# Patient Record
Sex: Female | Born: 1985 | Race: Black or African American | Hispanic: No | Marital: Single | Smoking: Never smoker
Health system: Southern US, Community
[De-identification: ages and names within clinical notes are randomized; demographics above are authoritative.]

## PROBLEM LIST (undated history)

## (undated) ENCOUNTER — Inpatient Hospital Stay (HOSPITAL_COMMUNITY): Payer: Self-pay

## (undated) DIAGNOSIS — Z789 Other specified health status: Secondary | ICD-10-CM

## (undated) DIAGNOSIS — D573 Sickle-cell trait: Secondary | ICD-10-CM

## (undated) HISTORY — DX: Sickle-cell trait: D57.3

## (undated) HISTORY — DX: Other specified health status: Z78.9

## (undated) HISTORY — PX: NO PAST SURGERIES: SHX2092

---

## 2011-12-26 ENCOUNTER — Encounter (HOSPITAL_COMMUNITY): Payer: Self-pay | Admitting: *Deleted

## 2011-12-26 ENCOUNTER — Emergency Department (HOSPITAL_COMMUNITY)
Admission: EM | Admit: 2011-12-26 | Discharge: 2011-12-26 | Disposition: A | Discharge status: Home / Self Care | Payer: Medicaid Other | Attending: Emergency Medicine | Admitting: Emergency Medicine

## 2011-12-26 DIAGNOSIS — R079 Chest pain, unspecified: Secondary | ICD-10-CM | POA: Insufficient documentation

## 2011-12-26 DIAGNOSIS — R112 Nausea with vomiting, unspecified: Secondary | ICD-10-CM | POA: Insufficient documentation

## 2011-12-26 DIAGNOSIS — O9989 Other specified diseases and conditions complicating pregnancy, childbirth and the puerperium: Secondary | ICD-10-CM | POA: Insufficient documentation

## 2011-12-26 DIAGNOSIS — R109 Unspecified abdominal pain: Secondary | ICD-10-CM | POA: Insufficient documentation

## 2011-12-26 DIAGNOSIS — Z3490 Encounter for supervision of normal pregnancy, unspecified, unspecified trimester: Secondary | ICD-10-CM

## 2011-12-26 LAB — URINALYSIS, ROUTINE W REFLEX MICROSCOPIC
Ketones, ur: 40 mg/dL — AB
Leukocytes, UA: NEGATIVE
Protein, ur: 100 mg/dL — AB
Urobilinogen, UA: 1 mg/dL (ref 0.0–1.0)

## 2011-12-26 LAB — URINE MICROSCOPIC-ADD ON

## 2011-12-26 LAB — CBC
MCH: 31.6 pg (ref 26.0–34.0)
MCHC: 36.5 g/dL — ABNORMAL HIGH (ref 30.0–36.0)
MCV: 86.6 fL (ref 78.0–100.0)
Platelets: 187 10*3/uL (ref 150–400)
RBC: 4.69 MIL/uL (ref 3.87–5.11)
RDW: 12 % (ref 11.5–15.5)

## 2011-12-26 LAB — BASIC METABOLIC PANEL
Calcium: 10.1 mg/dL (ref 8.4–10.5)
Creatinine, Ser: 0.67 mg/dL (ref 0.50–1.10)
GFR calc Af Amer: >90 mL/min (ref >90)
GFR calc non Af Amer: >90 mL/min (ref >90)
Sodium: 135 mEq/L (ref 135–145)

## 2011-12-26 LAB — DIFFERENTIAL
Basophils Absolute: 0 10*3/uL (ref 0.0–0.1)
Basophils Relative: 0 % (ref 0–1)
Eosinophils Relative: 0 % (ref 0–5)
Monocytes Absolute: 0.9 10*3/uL (ref 0.1–1.0)
Monocytes Relative: 10 % (ref 3–12)

## 2011-12-26 LAB — PREGNANCY, URINE: Preg Test, Ur: POSITIVE — AB

## 2011-12-26 MED ORDER — PANTOPRAZOLE SODIUM 40 MG IV SOLR
40.0000 mg | Freq: Once | INTRAVENOUS | Status: AC
  Administered 2011-12-26: 40 mg via INTRAVENOUS

## 2011-12-26 MED ORDER — PROMETHAZINE HCL 25 MG RE SUPP: 25.0000 mg | Freq: Four times a day (QID) | RECTAL | Status: DC | PRN

## 2011-12-26 MED ORDER — ONDANSETRON HCL 4 MG/2ML IJ SOLN
INTRAMUSCULAR | Status: AC
  Filled 2011-12-26: qty 2

## 2011-12-26 MED ORDER — ONDANSETRON HCL 4 MG/2ML IJ SOLN
4.0000 mg | Freq: Once | INTRAMUSCULAR | Status: AC
  Administered 2011-12-26: 4 mg via INTRAVENOUS

## 2011-12-26 MED ORDER — SODIUM CHLORIDE 0.9 % IV BOLUS (SEPSIS)
1000.0000 mL | Freq: Once | INTRAVENOUS | Status: AC
  Administered 2011-12-26: 1000 mL via INTRAVENOUS

## 2011-12-26 MED ORDER — ONDANSETRON HCL 4 MG/2ML IJ SOLN
4.0000 mg | Freq: Once | INTRAMUSCULAR | Status: AC
  Administered 2011-12-26: 4 mg via INTRAVENOUS
  Filled 2011-12-26: qty 2

## 2011-12-26 MED ORDER — GI COCKTAIL ~~LOC~~
30.0000 mL | Freq: Once | ORAL | Status: AC
  Administered 2011-12-26: 30 mL via ORAL
  Filled 2011-12-26: qty 30

## 2011-12-26 MED ORDER — ONDANSETRON HCL 4 MG PO TABS: 4.0000 mg | ORAL_TABLET | Freq: Four times a day (QID) | ORAL | Status: AC

## 2011-12-26 MED ORDER — POTASSIUM CHLORIDE CRYS ER 20 MEQ PO TBCR
10.0000 meq | EXTENDED_RELEASE_TABLET | Freq: Once | ORAL | Status: AC
  Administered 2011-12-26: 10 meq via ORAL
  Filled 2011-12-26: qty 1

## 2011-12-26 MED ORDER — PANTOPRAZOLE SODIUM 40 MG IV SOLR
INTRAVENOUS | Status: AC
  Filled 2011-12-26: qty 40

## 2011-12-26 NOTE — ED Provider Notes (Signed)
Medical screening examination/treatment/procedure(s) were performed by non-physician practitioner and as supervising physician I was immediately available for consultation/collaboration.   Glynn Octave, MD 12/26/11 1745

## 2011-12-26 NOTE — ED Provider Notes (Signed)
History     CSN: [id number]  Arrival date & time 12/26/11  0608   First MD Initiated Contact with Patient 12/26/11 [phone]      Chief Complaint  Patient presents with  . Emesis    (Consider location/radiation/quality/duration/timing/severity/associated sxs/prior treatment) Patient is a 26 y.o. female presenting with vomiting. The history is provided by the patient.  Emesis  This is a new problem. The current episode started more than 2 days ago. The problem occurs more than 10 times per day. The problem has not changed since onset.There has been no fever. Associated symptoms include abdominal pain. Pertinent negatives include no cough, no fever and no myalgias. Associated symptoms comments: She is in early pregnancy with her third child and reports previous 2 pregnancies associated with excessive vomiting in the first four months. No fever. She reports soreness in upper abdomen with vomiting and chest burning type symptoms. No cough, SOB. No vaginal bleeding, dysuria, or vaginal discharge.Marland Kitchen    History reviewed. No pertinent past medical history.  History reviewed. No pertinent past surgical history.  History reviewed. No pertinent family history.  History  Substance Use Topics  . Smoking status: Never Smoker   . Smokeless tobacco: Not on file  . Alcohol Use: Yes    OB History    Grav Para Term Preterm Abortions TAB SAB Ect Mult Living   1               Review of Systems  Constitutional: Negative for fever.  Respiratory: Negative for cough and shortness of breath.   Cardiovascular: Positive for chest pain.  Gastrointestinal: Positive for nausea, vomiting and abdominal pain.  Genitourinary: Negative for dysuria, vaginal bleeding and vaginal pain.  Musculoskeletal: Negative for myalgias.  Skin: Negative for rash.    Allergies  Aleve  Home Medications  No current outpatient prescriptions on file.  BP 112/84  Pulse 80  Temp(Src) 98.1 F (36.7 C) (Oral)  Resp 16   SpO2 99%  LMP 10/27/2011  Physical Exam  Constitutional: She appears well-developed and well-nourished.  HENT:  Head: Normocephalic.  Mouth/Throat: Mucous membranes are dry.  Neck: Normal range of motion. Neck supple.  Cardiovascular: Normal rate and regular rhythm.   Pulmonary/Chest: Effort normal and breath sounds normal. She has no wheezes. She has no rales.  Abdominal: Soft. Bowel sounds are normal. There is no tenderness. There is no rebound and no guarding.  Musculoskeletal: Normal range of motion. She exhibits no edema.  Neurological: She is alert. No cranial nerve deficit.  Skin: Skin is warm and dry. No rash noted.  Psychiatric: She has a normal mood and affect.    ED Course  Procedures (including critical care time)  Labs Reviewed  CBC - Abnormal; Notable for the following:    MCHC 36.5 (*)    All other components within normal limits  BASIC METABOLIC PANEL - Abnormal; Notable for the following:    Potassium 3.0 (*)    Chloride 93 (*)    Glucose, Bld 120 (*)    All other components within normal limits  URINALYSIS, ROUTINE W REFLEX MICROSCOPIC - Abnormal; Notable for the following:    APPearance HAZY (*)    Hgb urine dipstick SMALL (*)    Ketones, ur 40 (*)    Protein, ur 100 (*)    All other components within normal limits  PREGNANCY, URINE - Abnormal; Notable for the following:    Preg Test, Ur POSITIVE (*)    All other components within normal  limits  HCG, QUANTITATIVE, PREGNANCY - Abnormal; Notable for the following:    hCG, Beta Chain, Quant, [place] [address] (*)    All other components within normal limits  URINE MICROSCOPIC-ADD ON - Abnormal; Notable for the following:    Squamous Epithelial / LPF MANY (*)    Bacteria, UA FEW (*)    Casts HYALINE CASTS (*)    All other components within normal limits  DIFFERENTIAL   No results found.   No diagnosis found. 1. Nausea, vomiting 2. Pregnancy 3. H/o hyperemesis of pregnancy    MDM  Tolerating PO fluids.  IV fluid rehydration given. Patient feeling improved. Discussed resources of [facility] and encouraged follow up there if symptoms persist.         Rodena Medin, PA-C 12/26/11 1042

## 2011-12-26 NOTE — Discharge Instructions (Signed)
TAKE ZOFRAN FOR NAUSEA AND VOMITING. IF NO RELIEF FILL THE PRESCRIPTION FOR PHENERGAN SUPPOSITORIES AND USE AS DIRECTED. B.R.A.T. DIET FOR THE NEXT 6-8 HOURS THEN ADVANCE AS TOLERATED. PUSH FLUIDS. IF SYMPTOMS WORSEN, YOU ARE ENCOURAGED TO GO TO [facility] FOR FURTHER TREATMENT. RETURN HERE AS NEEDED.  B.R.A.T. Diet Your doctor has recommended the B.R.A.T. diet for you or your child until the condition improves. This is often used to help control diarrhea and vomiting symptoms. If you or your child can tolerate clear liquids, you may have:  Bananas.   Rice.   Applesauce.   Toast (and other simple starches such as crackers, potatoes, noodles).  Be sure to avoid dairy products, meats, and fatty foods until symptoms are better. Fruit juices such as apple, grape, and prune juice can make diarrhea worse. Avoid these. Continue this diet for 2 days or as instructed by your caregiver. Document Released: 08/13/2005 Document Revised: 08/02/2011 Document Reviewed: 01/30/2007 [facility] Patient Information 2012 [place], [place].

## 2011-12-26 NOTE — ED Notes (Signed)
C/o epigastric chest pain & burning, also nv, nv ongoing for ~ 1 week, also mentions cough and sob, but describes cough as spitting up/coughing,  upstates, "I am pregnant", "not sure how many weeks, LMP 3/2", (denies: fever or diarrhea). Pt seen by EDPA prior to RN assessment, see PA notes, orders received and initiated.

## 2011-12-26 NOTE — ED Notes (Signed)
Pt c/o n/v for the past week.  States unable to keep food down for the past 3 days, c/o chest burning.

## 2011-12-26 NOTE — ED Notes (Signed)
Pt ambulatory to bathroom for urine specimen. Continues to c/o burning epigastric pain and nausea.

## 2011-12-30 ENCOUNTER — Emergency Department (HOSPITAL_COMMUNITY): Payer: Medicaid Other

## 2011-12-30 ENCOUNTER — Emergency Department (HOSPITAL_COMMUNITY)
Admission: EM | Admit: 2011-12-30 | Discharge: 2011-12-30 | Disposition: A | Discharge status: Home / Self Care | Payer: Medicaid Other | Attending: Emergency Medicine | Admitting: Emergency Medicine

## 2011-12-30 ENCOUNTER — Encounter (HOSPITAL_COMMUNITY): Payer: Self-pay | Admitting: Emergency Medicine

## 2011-12-30 DIAGNOSIS — O2390 Unspecified genitourinary tract infection in pregnancy, unspecified trimester: Secondary | ICD-10-CM | POA: Insufficient documentation

## 2011-12-30 DIAGNOSIS — O2690 Pregnancy related conditions, unspecified, unspecified trimester: Secondary | ICD-10-CM | POA: Insufficient documentation

## 2011-12-30 DIAGNOSIS — Z3490 Encounter for supervision of normal pregnancy, unspecified, unspecified trimester: Secondary | ICD-10-CM

## 2011-12-30 DIAGNOSIS — N390 Urinary tract infection, site not specified: Secondary | ICD-10-CM | POA: Insufficient documentation

## 2011-12-30 DIAGNOSIS — R1031 Right lower quadrant pain: Secondary | ICD-10-CM | POA: Insufficient documentation

## 2011-12-30 LAB — WET PREP, GENITAL
Clue Cells Wet Prep HPF POC: NONE SEEN
Trich, Wet Prep: NONE SEEN
Yeast Wet Prep HPF POC: NONE SEEN

## 2011-12-30 LAB — URINALYSIS, ROUTINE W REFLEX MICROSCOPIC
Glucose, UA: NEGATIVE mg/dL
Glucose, UA: NEGATIVE mg/dL
Hgb urine dipstick: NEGATIVE
Hgb urine dipstick: NEGATIVE
Protein, ur: NEGATIVE mg/dL
Protein, ur: NEGATIVE mg/dL
Specific Gravity, Urine: 1.019 (ref 1.005–1.030)
Specific Gravity, Urine: 1.017 (ref 1.005–1.030)
Urobilinogen, UA: 1 mg/dL (ref 0.0–1.0)
pH: 6 (ref 5.0–8.0)

## 2011-12-30 LAB — URINE MICROSCOPIC-ADD ON

## 2011-12-30 LAB — HCG, QUANTITATIVE, PREGNANCY: hCG, Beta Chain, Quant, S: 46647 m[IU]/mL — ABNORMAL HIGH (ref <5)

## 2011-12-30 MED ORDER — PRENATAL COMPLETE 14-0.4 MG PO TABS: ORAL_TABLET | ORAL | Status: DC

## 2011-12-30 MED ORDER — NITROFURANTOIN MONOHYD MACRO 100 MG PO CAPS: 100.0000 mg | ORAL_CAPSULE | Freq: Two times a day (BID) | ORAL | Status: AC

## 2011-12-30 NOTE — ED Provider Notes (Signed)
Complains of suprapubic pain for approximately 2 days. Patient's last normal menstrual period was January 2013. She reports a few days of spotting in February has recently come off the Depo-Provera shot. On exam alert nontoxic Glasgow Coma Score 15 abdomen nondistended only tender at suprapubic area no guarding rigidity or rebound  Doug Sou, MD 12/30/11 2108

## 2011-12-30 NOTE — ED Notes (Signed)
In and out cath completed using sterile procedure. Pt verbalized understanding of procedure. Tolerated well

## 2011-12-30 NOTE — ED Notes (Signed)
Pt back in room from US.

## 2011-12-30 NOTE — ED Provider Notes (Signed)
History     CSN: [id number]  Arrival date & time 12/30/11  1536   None     No chief complaint on file.   (Consider location/radiation/quality/duration/timing/severity/associated sxs/prior treatment) HPI history of present illness chief complaint: Right low quadrant pain. Patient arrived by private vehicle. History provided by patient. No language barriers identified. Information not limited. Onset symptoms several days ago. Location right lower quadrant of abdomen. Symptoms improved spontaneously in about worsened by anything. Quality dull. Radiation none. Severity mild. Timing intermittent. Duration several days. Context the patient admits to some urinary symptoms. For associated signs and symptoms please refer to the review of systems. The treatment stripe prior to arrival. No recent medical care. Regarding patient's social history please for the nurse's notes. Regarding patient's social history she is monogamous with her husband and is not concerned he may have other partners at this time. Regarding patient's family history there is no history of appendicitis. I reviewed patient's past medical, past surgical, social history, as well as medications and allergies.  No past medical history on file.  No past surgical history on file.  No family history on file.  History  Substance Use Topics  . Smoking status: Never Smoker   . Smokeless tobacco: Not on file  . Alcohol Use: Yes    OB History    Grav Para Term Preterm Abortions TAB SAB Ect Mult Living   1               Review of Systems  Constitutional: Negative for fever and chills.  HENT: Negative for trouble swallowing, neck pain and neck stiffness.   Eyes: Negative for pain, discharge and itching.  Respiratory: Negative for cough, chest tightness and shortness of breath.   Cardiovascular: Negative for chest pain, palpitations and leg swelling.  Gastrointestinal: Negative for nausea, vomiting, abdominal pain, diarrhea,  constipation and blood in stool.  Genitourinary: Positive for pelvic pain. Negative for dysuria, urgency, frequency, hematuria, flank pain, decreased urine volume, vaginal bleeding, vaginal discharge, difficulty urinating and vaginal pain.  Musculoskeletal: Negative for back pain and joint swelling.  Skin: Negative for rash and wound.  Neurological: Negative for dizziness, tremors, seizures, syncope, facial asymmetry, speech difficulty, weakness, light-headedness, numbness and headaches.  Hematological: Negative for adenopathy. Does not bruise/bleed easily.  Psychiatric/Behavioral: Negative for confusion and decreased concentration.    Allergies  Aleve  Home Medications   Current Outpatient Rx  Name Route Sig Dispense Refill  . ONDANSETRON HCL 4 MG PO TABS Oral Take 1 tablet (4 mg total) by mouth every 6 (six) hours. 12 tablet 0    BP 119/71  Pulse 80  Temp(Src) 98.2 F (36.8 C) (Oral)  Resp 17  SpO2 100%  LMP 10/27/2011  Physical Exam  Constitutional: She is oriented to person, place, and time. She appears well-developed and well-nourished. No distress.  HENT:  Head: Normocephalic and atraumatic.  Eyes: Conjunctivae are normal. Right eye exhibits no discharge. Left eye exhibits no discharge. No scleral icterus.  Neck: Normal range of motion. Neck supple.  Cardiovascular: Normal rate, regular rhythm, normal heart sounds and intact distal pulses.   No murmur heard. Pulmonary/Chest: Effort normal and breath sounds normal. No respiratory distress.  Abdominal: Soft. Bowel sounds are normal. She exhibits no distension and no mass. There is no hepatosplenomegaly. There is tenderness in the suprapubic area. There is no rigidity, no rebound, no guarding, no CVA tenderness, no tenderness at McBurney's point and negative Murphy's sign. No hernia. Hernia confirmed negative in the  right inguinal area and confirmed negative in the left inguinal area.         At the time of exam there is no  abdominal pain whatsoever. Patient states the area of pain which is circled on the diagram is where she had pain prior to arrival.  Genitourinary: Uterus normal. There is breast tenderness. No breast swelling, discharge or bleeding. There is no rash, tenderness, lesion or injury on the right labia. There is no rash, tenderness, lesion or injury on the left labia. Cervix exhibits no motion tenderness and no discharge. Right adnexum displays no mass, no tenderness and no fullness. Left adnexum displays no mass, no tenderness and no fullness. No erythema, tenderness or bleeding around the vagina. No foreign body around the vagina. No signs of injury around the vagina. No vaginal discharge found.       Chaperone present at all times.  Musculoskeletal: Normal range of motion. She exhibits no tenderness.  Lymphadenopathy:       Right: No inguinal adenopathy present.       Left: No inguinal adenopathy present.  Neurological: She is alert and oriented to person, place, and time.  Skin: Skin is warm and dry. She is not diaphoretic.  Psychiatric: She has a normal mood and affect.    ED Course  Procedures (including critical care time)  Labs Reviewed  URINALYSIS, ROUTINE W REFLEX MICROSCOPIC - Abnormal; Notable for the following:    Leukocytes, UA SMALL (*)    All other components within normal limits  HCG, QUANTITATIVE, PREGNANCY - Abnormal; Notable for the following:    hCG, Beta Chain, Quant, [place] [address] (*)    All other components within normal limits  WET PREP, GENITAL - Abnormal; Notable for the following:    WBC, Wet Prep HPF POC RARE (*)    All other components within normal limits  URINE MICROSCOPIC-ADD ON - Abnormal; Notable for the following:    Squamous Epithelial / LPF MANY (*)    Bacteria, UA FEW (*)    All other components within normal limits  URINALYSIS, ROUTINE W REFLEX MICROSCOPIC - Abnormal; Notable for the following:    APPearance CLOUDY (*)    Ketones, ur 15 (*)    Leukocytes,  UA MODERATE (*)    All other components within normal limits  ABO/RH  URINE MICROSCOPIC-ADD ON  URINE CULTURE  GC/CHLAMYDIA PROBE AMP, GENITAL  URINE CULTURE   US Ob Comp Less 14 Wks  12/30/2011  *RADIOLOGY REPORT*  Clinical Data: Right lower quadrant abdominal pain.  Pregnant.  OBSTETRIC <14 WK [place] AND TRANSVAGINAL OB US  Technique:  Both transabdominal and transvaginal ultrasound examinations were performed for complete evaluation of the gestation as well as the maternal uterus, adnexal regions, and pelvic cul-de-sac.  Transvaginal technique was performed to assess early pregnancy.  Comparison:  None.  Intrauterine gestational sac:  Visualized/normal in shape. Yolk sac: Present Embryo: Present Cardiac Activity: Present Heart Rate: 165 bpm  MSD:   mm      w     d CRL: 15.0   mm  seven   w  six   d        [place] EDC: 08/11/2012.  Maternal uterus/adnexae: Normal ovaries. Corpus luteum cyst on the right.  No free pelvic fluid collections.  IMPRESSION: Single living intrauterine embryo estimated at 7 weeks and 6 days gestation.  Original Report Authenticated By: P. Loralie Champagne, M.D.   US Ob Transvaginal  12/30/2011  *RADIOLOGY REPORT*  Clinical Data:  Right lower quadrant abdominal pain.  Pregnant.  OBSTETRIC <14 WK [place] AND TRANSVAGINAL OB US  Technique:  Both transabdominal and transvaginal ultrasound examinations were performed for complete evaluation of the gestation as well as the maternal uterus, adnexal regions, and pelvic cul-de-sac.  Transvaginal technique was performed to assess early pregnancy.  Comparison:  None.  Intrauterine gestational sac:  Visualized/normal in shape. Yolk sac: Present Embryo: Present Cardiac Activity: Present Heart Rate: 165 bpm  MSD:   mm      w     d CRL: 15.0   mm  seven   w  six   d        [place] EDC: 08/11/2012.  Maternal uterus/adnexae: Normal ovaries. Corpus luteum cyst on the right.  No free pelvic fluid collections.  IMPRESSION: Single living intrauterine embryo estimated at  7 weeks and 6 days gestation.  Original Report Authenticated By: P. Loralie Champagne, M.D.     1. UTI (lower urinary tract infection)   2. Currently pregnant       MDM  Pt is a well-appearing 26 year old African female who is approximately [redacted] weeks pregnant by last menstrual period but has irregular periods secondary to Depo who presents with right suprapubic pain. Pain present for a few days and is intermittent in nature only lasting 30 seconds and disappearing for hours at a time. Patient denies any cramping, vaginal bleeding, vaginal discharge. Vital signs stable patient is afebrile in acute distress. No pain whatsoever on abdominal exam. Appendicitis is felt to be very unlikely at this time. PID is also very doubtful by exam. Transvaginal ultrasound demonstrates a viable intrauterine pregnancy at approximately 7 weeks 6 days. Of note mother is Rh+ so no RhoGAM was given. Urinalysis by catheter specimen demonstrates a likely urinary infection. Patient was prescribed Macrobid and prenatal vitamins. Of note patient has not had any prenatal care so OB/GYN contact information was given. Patient does have insurance and states she just not taking time to set up prenatal care. Discharged home in stable condition.        Consuello Masse, MD 12/31/11 1610  Consuello Masse, MD 12/31/11 [phone]

## 2011-12-30 NOTE — ED Notes (Addendum)
Pt c/o RLQ pain x3 days, pt reports she has had a positive home pregnancy test but no prenatal care. Pt states she has noticed pink color blood on toilet paper after she urinates. Denies any burning or discomfort with urination, denies vaginal d/c.

## 2011-12-30 NOTE — Discharge Instructions (Signed)
Urinary Tract Infection in Pregnancy A urinary tract infection (UTI) is a bacterial infection of the urinary tract. Infection of the urinary tract can include the ureters, kidneys (pyelonephritis), bladder (cystitis), and urethra (urethritis). All pregnant women should be screened for bacteria in the urinary tract. Identifying and treating a UTI will decrease the risk of preterm labor and developing more serious infections in both the mother and baby. CAUSES Bacteria germs cause almost all UTIs. There are many factors that can increase your chances of getting a UTI during pregnancy. These include:  Having a short urethra.   Poor toilet and hygiene habits.   Sexual intercourse.   Blockage of urine along the urinary tract.   Problems with the pelvic muscles or nerves.   Diabetes.   Obesity.   Bladder problems after having several children.   Previous history of UTI.  SYMPTOMS   Pain, burning, or a stinging feeling when urinating.   Suddenly feeling the need to urinate right away (urgency).   Loss of bladder control (urinary incontinence).   Frequent urination, more than is common with pregnancy.   Lower abdominal or back discomfort.   Bad smelling urine.   Cloudy urine.   Blood in the urine (hematuria).   Fever.  When the kidneys are infected, the symptoms may be:  Back pain.   Flank pain on the right side more so than the left.   Fever.   Chills.   Nausea.   Vomiting.  DIAGNOSIS   Urine tests.   Additional tests and procedures may include:   Ultrasound of the kidneys, ureters, bladder, and urethra.   Looking in the bladder with a lighted tube (cystoscopy).   Certain X-ray studies only when absolutely necessary.  Finding out the results of your test Ask when your test results will be ready. Make sure you get your test results. TREATMENT  Antibiotic medicine by mouth.   Antibiotics given through the vein (intravenously), if needed.  HOME CARE  INSTRUCTIONS   Take your antibiotics as directed. Finish them even if you start to feel better. Only take medicine as directed by your caregiver.   Drink enough fluids to keep your urine clear or pale yellow.   Do not have sexual intercourse until the infection is gone and your caregiver says it is okay.   Make sure you are tested for UTIs throughout your pregnancy if you get one. These infections often come back.  Preventing a UTI in the future:  Practice good toilet habits. Always wipe from front to back. Use the tissue only once.   Do not hold your urine. Empty your bladder as soon as possible when the urge comes.   Do not douche or use deodorant sprays.   Wash with soap and warm water around the genital area and the anus.   Empty your bladder before and after sexual intercourse.   Wear underwear with a cotton crotch.   Avoid caffeine and carbonated drinks. They can irritate the bladder.   Drink cranberry juice or take cranberry pills. This may decrease the risk of getting a UTI.   Do not drink alcohol.   Keep all your appointments and tests as scheduled.  SEEK MEDICAL CARE IF:   Your symptoms get worse.   You are still having fevers 2 or more days after treatment begins.   You develop a rash.   You feel that you are having problems with medicines prescribed.   You develop abnormal vaginal discharge.  SEEK IMMEDIATE MEDICAL   CARE IF:   You develop back or flank pain.   You develop chills.   You have blood in your urine.   You develop nausea and vomiting.   You develop contractions of your uterus.   You have a gush of fluid from the vagina.  MAKE SURE YOU:   Understand these instructions.   Will watch your condition.   Will get help right away if you are not doing well or get worse.  Document Released: 12/08/2010 Document Revised: 08/02/2011 Document Reviewed: 12/08/2010 [facility] Patient Information 2012 [place], [place].Prenatal Care  WHAT IS PRENATAL  CARE?  Prenatal care means health care during your pregnancy, before your baby is born. Take care of yourself and your baby by:   Getting early prenatal care. If you know you are pregnant, or think you might be pregnant, call your caregiver as soon as possible. Schedule a visit for a general/prenatal examination.   Getting regular prenatal care. Follow your caregiver's schedule for blood and other necessary tests. Do not miss appointments.   Do everything you can to keep yourself and your baby healthy during your pregnancy.   Prenatal care should include evaluation of medical, dietary, educational, psychological, and social needs for the couple and the medical, surgical, and genetic history of the family of the mother and father.   Discuss with your caregiver:   Your medicines, prescription, over-the-counter, and herbal medicines.   Substance abuse, alcohol, smoking, and illegal drugs.   Domestic abuse and violence, if present.   Your immunizations.   Nutrition and diet.   Exercising.   Environment and occupational hazards, at home and at work.   History of sexually transmitted disease, both you and your partner.   Previous pregnancies.  WHY IS PRENATAL CARE SO IMPORTANT?  By seeing you regularly, your caregiver has the chance to find problems early, so that they can be treated as soon as possible. Other problems might be prevented. Many studies have shown that early and regular prenatal care is important for the health of both mothers and their babies.  I AM THINKING ABOUT GETTING PREGNANT. HOW CAN I TAKE CARE OF MYSELF?  Taking care of yourself before you get pregnant helps you to have a healthy pregnancy. It also lowers your chances of having a baby born with a birth defect. Here are ways to take care of yourself before you get pregnant:   Eat healthy foods, exercise regularly (30 minutes per day for most days of the week is best), and get enough rest and sleep. Talk to your  caregiver about what kinds of foods and exercises are best for you.   Take 400 micrograms (mcg) of folic acid (one of the B vitamins) every day. The best way to do this is to take a daily multivitamin pill that contains this amount of folic acid. Getting enough of the synthetic (manufactured) form of folic acid every day before you get pregnant and during early pregnancy can help prevent certain birth defects. Many breakfast cereals and other grain products have folic acid added to them, but only certain cereals contain 400 mcg of folic acid per serving. Check the label on your multivitamin or cereal to find the amount of folic acid in the food.   See your caregiver for a complete check up before getting pregnant. Make sure that you have had all your immunization shots, especially for rubella ([place] measles). Rubella can cause serious birth defects. Chickenpox is another illness you want to avoid during pregnancy.  If you have had chickenpox and rubella in the past, you should be immune to them.   Tell your caregiver about any prescription or non-prescription medicines (including herbal remedies) you are taking. Some medicines are not safe to take during pregnancy.   Stop smoking cigarettes, drinking alcohol, or taking illegal drugs. Ask your caregiver for help, if you need it. You can also get help with alcohol and drugs by talking with a member of your faith community, a counselor, or a trusted friend.   Discuss and treat any medical, social, or psychological problems before getting pregnant.   Discuss any history of genetic problems in the mother, father, and their families. Do genetic testing before getting pregnant, when possible.   Discuss any physical or emotional abuse with your caregiver.   Discuss with your caregiver if you might be exposed to harmful chemicals on your job or where you live.   Discuss with your caregiver if you think your job or the hours you work may be harmful and  should be changed.   The father should be involved with the decision making and with all aspects of the pregnancy, labor, and delivery.   If you have medical insurance, make sure you are covered for pregnancy.  I JUST FOUND OUT THAT I AM PREGNANT. HOW CAN I TAKE CARE OF MYSELF?  Here are ways to take care of yourself and the precious new life growing inside you:   Continue taking your multivitamin with 400 micrograms (mcg) of folic acid every day.   Get early and regular prenatal care. It does not matter if this is your first pregnancy or if you already have children. It is very important to see a caregiver during your pregnancy. Your caregiver will check at each visit to make sure that you and the baby are healthy. If there are any problems, action can be taken right away to help you and the baby.   Eat a healthy diet that includes:   Fruits.   Vegetables.   Foods low in saturated fat.   Grains.   Calcium-rich foods.   Drink 6 to 8 glasses of liquids a day.   Unless your caregiver tells you not to, try to be physically active for 30 minutes, most days of the week. If you are pressed for time, you can get your activity in through 10 minute segments, three times a day.   If you smoke, drink alcohol, or use drugs, STOP. These can cause long-term damage to your baby. Talk with your caregiver about steps to take to stop smoking. Talk with a member of your faith community, a counselor, a trusted friend, or your caregiver if you are concerned about your alcohol or drug use.   Ask your caregiver before taking any medicine, even over-the-counter medicines. Some medicines are not safe to take during pregnancy.   Get plenty of rest and sleep.   Avoid hot tubs and saunas during pregnancy.   Do not have X-rays taken, unless absolutely necessary and with the recommendation of your caregiver. A lead shield can be placed on your abdomen, to protect the baby when X-rays are taken in other parts  of the body.   Do not empty the cat litter when you are pregnant. It may contain a parasite that causes an infection called toxoplasmosis, which can cause birth defects. Also, use gloves when working in garden areas used by cats.   Do not eat uncooked or undercooked cheese, meats, or fish.  Stay away from toxic chemicals like:   Insecticides.   Solvents (some cleaners or paint thinners).   Lead.   Mercury.   Sexual relations may continue until the end of the pregnancy, unless you have a medical problem or there is a problem with the pregnancy and your caregiver tells you not to.   Do not wear high heel shoes, especially during the second half of the pregnancy. You can lose your balance and fall.   Do not take long trips, unless absolutely necessary. Be sure to see your caregiver before going on the trip.   Do not sit in one position for more than 2 hours, when on a trip.   Take a copy of your medical records when going on a trip.   Know where there is a hospital in the city you are visiting, in case of an emergency.   Most dangerous household products will have pregnancy warnings on their labels. Ask your caregiver about products if you are unsure.   Limit or eliminate your caffeine intake from coffee, tea, sodas, medicines, and chocolate.   Many women continue working through pregnancy. Staying active might help you stay healthier. If you have a question about the safety or the hours you work at your particular job, talk with your caregiver.   Get informed:   Read books.   Watch videos.   Go to childbirth classes for you and the father.   Talk with experienced moms.   Ask your caregiver about childbirth education classes for you and your partner. Classes can help you and your partner prepare for the birth of your baby.   Ask about a pediatrician (baby doctor) and methods and pain medicine for labor, delivery, and possible Cesarean delivery (C-section).  I AM NOT  THINKING ABOUT GETTING PREGNANT RIGHT NOW, BUT HEARD THAT ALL WOMEN SHOULD TAKE FOLIC ACID EVERY DAY?  All women of childbearing age, with even a remote chance of getting pregnant, should try to make sure they get enough folic acid. Many pregnancies are not planned. Many women do not know they are actually pregnant early in their pregnancies, and certain birth defects happen in the very early part of pregnancy. Taking 400 micrograms (mcg) of folic acid every day will help prevent certain birth defects that happen in the early part of pregnancy. If a woman begins taking vitamin pills in the second or third month of pregnancy, it may be too late to prevent birth defects. Folic acid may also have other health benefits for women, besides preventing birth defects.  HOW OFTEN SHOULD I SEE MY CAREGIVER DURING PREGNANCY?  Your caregiver will give you a schedule for your prenatal visits. You will have visits more often as you get closer to the end of your pregnancy. An average pregnancy lasts about 40 weeks.  A typical schedule includes visiting your caregiver:   About once each month, during your first 6 months of pregnancy.   Every 2 weeks, during the next 2 months.   Weekly in the last month, until the delivery date.  Your caregiver will probably want to see you more often if:  You are over 35.   Your pregnancy is high risk, because you have certain health problems or problems with the pregnancy, such as:   Diabetes.   High blood pressure.   The baby is not growing on schedule, according to the dates of the pregnancy.  Your caregiver will do special tests, to make sure you and the baby  are not having any serious problems. WHAT HAPPENS DURING PRENATAL VISITS?   At your first prenatal visit, your caregiver will talk to you about you and your partner's health history and your family's health history, and will do a physical exam.   On your first visit, a physical exam will include checks of your  blood pressure, height and weight, and an exam of your pelvic organs. Your caregiver will do a Pap test if you have not had one recently, and will do cultures of your cervix to make sure there is no infection.   At each visit, there will be tests of your blood, urine, blood pressure, weight, and checking the progress of the baby.   Your caregiver will be able to tell you when to expect that your baby will be born.   Each visit is also a chance for you to learn about staying healthy during pregnancy and for asking questions.   Discuss whether you will be breastfeeding.   At your later prenatal visits, your caregiver will check how you are doing and how the baby is developing. You may have a number of tests done as your pregnancy progresses.   Ultrasound exams are often used to check on the baby's growth and health.   You may have more urine and blood tests, as well as special tests, if needed. These may include amniocentesis (examine fluid in the pregnancy sac), stress tests (check how baby responds to contractions), biophysical profile (measures fetus well-being). Your caregiver will explain the tests and why they are necessary.  I AM IN MY LATE THIRTIES, AND I WANT TO HAVE A CHILD NOW. SHOULD I DO ANYTHING SPECIAL?  As you get older, there is more chance of having a medical problem (high blood pressure), pregnancy problem (preeclampsia, problems with the placenta), miscarriage, or a baby born with a birth defect. However, most women in their late thirties and early forties have healthy babies. See your caregiver on a regular basis before you get pregnant and be sure to go for exams throughout your pregnancy. Your caregiver probably will want to do some special tests to check on you and your baby's health when you are pregnant.  Women today are often delaying having children until later in life, when they are in their thirties and forties. While many women in their thirties and forties have no  difficulty getting pregnant, fertility does decline with age. For women over 40 who cannot get pregnant after 6 months of trying, it is recommended that they see their caregiver for a fertility evaluation. It is not uncommon to have trouble becoming pregnant or experience infertility (inability to become pregnant after trying for one year). If you think that you or your partner may be infertile, you can discuss this with your caregiver. He or she can recommend treatments such as drugs, surgery, or assisted reproductive technology.  Document Released: 08/16/2003 Document Revised: 08/02/2011 Document Reviewed: 07/13/2009 [facility] Patient Information 2012 [place], [place].

## 2011-12-30 NOTE — ED Notes (Signed)
Pt given rx x 2, discharge and follow up instructions without further questions after speaking to EDP. Ambulates to lobby in NAD

## 2011-12-31 LAB — GC/CHLAMYDIA PROBE AMP, GENITAL: GC Probe Amp, Genital: NEGATIVE

## 2012-01-01 NOTE — ED Provider Notes (Signed)
I have personally seen and examined the patient.  I have discussed the plan of care with the resident.  I have reviewed the documentation on PMH/FH/Soc. History.  I have reviewed the documentation of the resident and agree.  Doug Sou, MD 01/01/12 1729

## 2012-01-02 LAB — URINE CULTURE

## 2012-01-03 NOTE — ED Notes (Signed)
+   Urine Chart sent to EDP office for review. 

## 2012-01-04 NOTE — ED Notes (Signed)
Chart returned from EDP office. Switch to Bactrim DS 1 po bid x 5 days. Prescribed by Felicie Morn NPC.

## 2012-01-05 NOTE — ED Notes (Signed)
RX called to [facility] on [address] by Norm Parcel PFM.

## 2012-01-05 NOTE — ED Notes (Signed)
Called patient and informed them of change of medication. Wants RX called to [facility] on [address].

## 2012-04-17 ENCOUNTER — Encounter (HOSPITAL_COMMUNITY): Payer: Self-pay | Admitting: Emergency Medicine

## 2012-04-17 ENCOUNTER — Emergency Department (HOSPITAL_COMMUNITY)
Admission: EM | Admit: 2012-04-17 | Discharge: 2012-04-17 | Disposition: A | Discharge status: Home / Self Care | Payer: No Typology Code available for payment source | Attending: Emergency Medicine | Admitting: Emergency Medicine

## 2012-04-17 DIAGNOSIS — T07XXXA Unspecified multiple injuries, initial encounter: Secondary | ICD-10-CM

## 2012-04-17 DIAGNOSIS — Z888 Allergy status to other drugs, medicaments and biological substances status: Secondary | ICD-10-CM | POA: Insufficient documentation

## 2012-04-17 DIAGNOSIS — IMO0002 Reserved for concepts with insufficient information to code with codable children: Secondary | ICD-10-CM | POA: Insufficient documentation

## 2012-04-17 DIAGNOSIS — M549 Dorsalgia, unspecified: Secondary | ICD-10-CM | POA: Insufficient documentation

## 2012-04-17 MED ORDER — TRAMADOL HCL 50 MG PO TABS: 50.0000 mg | ORAL_TABLET | Freq: Four times a day (QID) | ORAL | Status: AC | PRN

## 2012-04-17 MED ORDER — CYCLOBENZAPRINE HCL 5 MG PO TABS: 5.0000 mg | ORAL_TABLET | Freq: Three times a day (TID) | ORAL | Status: AC | PRN
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
## 2012-04-18 ENCOUNTER — Emergency Department (HOSPITAL_COMMUNITY)
Admission: EM | Admit: 2012-04-18 | Discharge: 2012-04-18 | Disposition: A | Discharge status: Home / Self Care | Payer: No Typology Code available for payment source | Attending: Emergency Medicine | Admitting: Emergency Medicine

## 2012-04-18 ENCOUNTER — Emergency Department (HOSPITAL_COMMUNITY): Payer: No Typology Code available for payment source

## 2012-04-18 DIAGNOSIS — IMO0001 Reserved for inherently not codable concepts without codable children: Secondary | ICD-10-CM | POA: Insufficient documentation

## 2012-04-18 DIAGNOSIS — M542 Cervicalgia: Secondary | ICD-10-CM | POA: Insufficient documentation

## 2012-04-18 DIAGNOSIS — Y92410 Unspecified street and highway as the place of occurrence of the external cause: Secondary | ICD-10-CM | POA: Insufficient documentation

## 2012-04-18 DIAGNOSIS — M7918 Myalgia, other site: Secondary | ICD-10-CM

## 2012-04-18 MED ORDER — IBUPROFEN 400 MG PO TABS
800.0000 mg | ORAL_TABLET | Freq: Once | ORAL | Status: AC
  Administered 2012-04-18: 800 mg via ORAL
  Filled 2012-04-18: qty 2

## 2012-04-18 MED ORDER — DIAZEPAM 5 MG PO TABS
10.0000 mg | ORAL_TABLET | Freq: Once | ORAL | Status: AC
  Administered 2012-04-18: 10 mg via ORAL
  Filled 2012-04-18: qty 2

## 2012-04-18 MED ORDER — DIAZEPAM 5 MG PO TABS: 5.0000 mg | ORAL_TABLET | Freq: Two times a day (BID) | ORAL | Status: AC

## 2012-04-18 MED ORDER — HYDROMORPHONE HCL PF 1 MG/ML IJ SOLN
1.0000 mg | Freq: Once | INTRAMUSCULAR | Status: AC
  Administered 2012-04-18: 1 mg via INTRAMUSCULAR
  Filled 2012-04-18: qty 1
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
## 2012-04-18 NOTE — ED Notes (Signed)
Patient has full range of motion of right upper extremity with assistance.  Pain limiting range of motion of RUE states pain 10/10. Bilateral equal hand grasps strong.

## 2012-04-18 NOTE — Progress Notes (Signed)
Orthopedic Tech Progress Note Patient Details:  Misty Hall [birth year] [id number]  Ortho Devices Type of Ortho Device: [place] cervical collar;Other (comment) (soft cervical collar) Ortho Device/Splint Location: neck  Ortho Device/Splint Interventions: Application   Keeya Dyckman 04/18/2012, 3:48 PM

## 2012-04-18 NOTE — ED Notes (Signed)
Paged Ortho  

## 2012-04-18 NOTE — Progress Notes (Signed)
Orthopedic Tech Progress Note Patient Details:  Makenize Messman Sep 26, 1985 [id number]  Patient ID: Dellis Anes, female   DOB: 30-Sep-1985, 26 y.o.   MRN: [id number] Viewed order from doctor's order list  Nikki Dom 04/18/2012, 3:48 PM
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
## 2012-04-18 NOTE — ED Notes (Signed)
Ortho at bedside.
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
## 2012-04-21 ENCOUNTER — Emergency Department (INDEPENDENT_AMBULATORY_CARE_PROVIDER_SITE_OTHER)
Admission: EM | Admit: 2012-04-21 | Discharge: 2012-04-21 | Disposition: A | Discharge status: Home / Self Care | Payer: Self-pay | Source: Home / Self Care | Attending: Emergency Medicine | Admitting: Emergency Medicine

## 2012-04-21 ENCOUNTER — Encounter (HOSPITAL_COMMUNITY): Payer: Self-pay

## 2012-04-21 DIAGNOSIS — T07XXXA Unspecified multiple injuries, initial encounter: Secondary | ICD-10-CM

## 2012-04-21 DIAGNOSIS — S161XXA Strain of muscle, fascia and tendon at neck level, initial encounter: Secondary | ICD-10-CM

## 2012-04-21 DIAGNOSIS — S139XXA Sprain of joints and ligaments of unspecified parts of neck, initial encounter: Secondary | ICD-10-CM

## 2012-04-21 DIAGNOSIS — S46019A Strain of muscle(s) and tendon(s) of the rotator cuff of unspecified shoulder, initial encounter: Secondary | ICD-10-CM

## 2012-04-21 MED ORDER — CYCLOBENZAPRINE HCL 5 MG PO TABS: 5.0000 mg | ORAL_TABLET | Freq: Three times a day (TID) | ORAL | Status: AC | PRN

## 2012-04-21 MED ORDER — PREDNISONE 5 MG PO KIT: 1.0000 | PACK | Freq: Every day | ORAL | Status: DC

## 2012-04-21 MED ORDER — HYDROCODONE-ACETAMINOPHEN 5-325 MG PO TABS: ORAL_TABLET | ORAL | Status: AC

## 2012-04-21 MED ORDER — ZOLPIDEM TARTRATE 10 MG PO TABS: 10.0000 mg | ORAL_TABLET | Freq: Every evening | ORAL | Status: DC | PRN
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
## 2012-05-13 ENCOUNTER — Emergency Department (HOSPITAL_COMMUNITY)
Admission: EM | Admit: 2012-05-13 | Discharge: 2012-05-13 | Disposition: A | Discharge status: Home / Self Care | Payer: No Typology Code available for payment source | Attending: Emergency Medicine | Admitting: Emergency Medicine

## 2012-05-13 ENCOUNTER — Encounter (HOSPITAL_COMMUNITY): Payer: Self-pay | Admitting: *Deleted

## 2012-05-13 DIAGNOSIS — R209 Unspecified disturbances of skin sensation: Secondary | ICD-10-CM | POA: Insufficient documentation

## 2012-05-13 DIAGNOSIS — S161XXA Strain of muscle, fascia and tendon at neck level, initial encounter: Secondary | ICD-10-CM

## 2012-05-13 DIAGNOSIS — M542 Cervicalgia: Secondary | ICD-10-CM | POA: Insufficient documentation

## 2012-05-13 DIAGNOSIS — M549 Dorsalgia, unspecified: Secondary | ICD-10-CM | POA: Insufficient documentation
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
## 2012-05-27 ENCOUNTER — Ambulatory Visit: Payer: Medicaid Other | Attending: Pediatrics | Admitting: Physical Therapy

## 2012-05-27 DIAGNOSIS — R262 Difficulty in walking, not elsewhere classified: Secondary | ICD-10-CM | POA: Insufficient documentation

## 2012-05-27 DIAGNOSIS — M2560 Stiffness of unspecified joint, not elsewhere classified: Secondary | ICD-10-CM | POA: Insufficient documentation

## 2012-05-27 DIAGNOSIS — IMO0001 Reserved for inherently not codable concepts without codable children: Secondary | ICD-10-CM | POA: Insufficient documentation

## 2012-05-30 ENCOUNTER — Encounter: Payer: Self-pay | Admitting: Physical Therapy

## 2012-06-02 ENCOUNTER — Encounter: Payer: Self-pay | Admitting: Rehabilitation

## 2012-06-04 ENCOUNTER — Ambulatory Visit: Payer: Medicaid Other | Admitting: Rehabilitation

## 2012-06-10 ENCOUNTER — Encounter: Payer: Self-pay | Admitting: Rehabilitation

## 2012-06-12 ENCOUNTER — Encounter: Payer: Self-pay | Admitting: Rehabilitation
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
## 2013-10-08 ENCOUNTER — Encounter (HOSPITAL_COMMUNITY): Payer: Self-pay | Admitting: Emergency Medicine

## 2013-10-08 ENCOUNTER — Emergency Department (HOSPITAL_COMMUNITY)
Admission: EM | Admit: 2013-10-08 | Discharge: 2013-10-08 | Disposition: A | Discharge status: Home / Self Care | Payer: Medicaid Other | Attending: Emergency Medicine | Admitting: Emergency Medicine

## 2013-10-08 DIAGNOSIS — N390 Urinary tract infection, site not specified: Secondary | ICD-10-CM | POA: Insufficient documentation

## 2013-10-08 DIAGNOSIS — Z3202 Encounter for pregnancy test, result negative: Secondary | ICD-10-CM | POA: Insufficient documentation

## 2013-10-08 LAB — URINE MICROSCOPIC-ADD ON

## 2013-10-08 LAB — URINALYSIS, ROUTINE W REFLEX MICROSCOPIC
Bilirubin Urine: NEGATIVE
Glucose, UA: NEGATIVE mg/dL
Ketones, ur: NEGATIVE mg/dL
Nitrite: POSITIVE — AB
Protein, ur: 100 mg/dL — AB
Specific Gravity, Urine: 1.02 (ref 1.005–1.030)
Urobilinogen, UA: 0.2 mg/dL (ref 0.0–1.0)
pH: 6 (ref 5.0–8.0)

## 2013-10-08 LAB — PREGNANCY, URINE: Preg Test, Ur: NEGATIVE

## 2013-10-08 MED ORDER — OXYCODONE-ACETAMINOPHEN 5-325 MG PO TABS: 1.0000 | ORAL_TABLET | ORAL | Status: DC | PRN

## 2013-10-08 MED ORDER — CIPROFLOXACIN HCL 500 MG PO TABS
500.0000 mg | ORAL_TABLET | Freq: Once | ORAL | Status: AC
  Administered 2013-10-08: 500 mg via ORAL
  Filled 2013-10-08: qty 1

## 2013-10-08 MED ORDER — CIPROFLOXACIN HCL 500 MG PO TABS: 500.0000 mg | ORAL_TABLET | Freq: Two times a day (BID) | ORAL | Status: DC

## 2013-10-08 NOTE — ED Notes (Signed)
Pt reports lower abdominal pain and pressure for 2 days with nausea

## 2013-10-08 NOTE — ED Notes (Signed)
Pt comfortable with d/c and f/u instructions. Prescriptions x2. 

## 2013-10-08 NOTE — Discharge Instructions (Signed)
Urinary Tract Infection °Urinary tract infections (UTIs) can develop anywhere along your urinary tract. Your urinary tract is your body's drainage system for removing wastes and extra water. Your urinary tract includes two kidneys, two ureters, a bladder, and a urethra. Your kidneys are a pair of bean-shaped organs. Each kidney is about the size of your fist. They are located below your ribs, one on each side of your spine. °CAUSES °Infections are caused by microbes, which are microscopic organisms, including fungi, viruses, and bacteria. These organisms are so small that they can only be seen through a microscope. Bacteria are the microbes that most commonly cause UTIs. °SYMPTOMS  °Symptoms of UTIs may vary by age and gender of the patient and by the location of the infection. Symptoms in young women typically include a frequent and intense urge to urinate and a painful, burning feeling in the bladder or urethra during urination. Older women and men are more likely to be tired, shaky, and weak and have muscle aches and abdominal pain. A fever may mean the infection is in your kidneys. Other symptoms of a kidney infection include pain in your back or sides below the ribs, nausea, and vomiting. °DIAGNOSIS °To diagnose a UTI, your caregiver will ask you about your symptoms. Your caregiver also will ask to provide a urine sample. The urine sample will be tested for bacteria and white blood cells. White blood cells are made by your body to help fight infection. °TREATMENT  °Typically, UTIs can be treated with medication. Because most UTIs are caused by a bacterial infection, they usually can be treated with the use of antibiotics. The choice of antibiotic and length of treatment depend on your symptoms and the type of bacteria causing your infection. °HOME CARE INSTRUCTIONS °· If you were prescribed antibiotics, take them exactly as your caregiver instructs you. Finish the medication even if you feel better after you  have only taken some of the medication. °· Drink enough water and fluids to keep your urine clear or pale yellow. °· Avoid caffeine, tea, and carbonated beverages. They tend to irritate your bladder. °· Empty your bladder often. Avoid holding urine for long periods of time. °· Empty your bladder before and after sexual intercourse. °· After a bowel movement, women should cleanse from front to back. Use each tissue only once. °SEEK MEDICAL CARE IF:  °· You have back pain. °· You develop a fever. °· Your symptoms do not begin to resolve within 3 days. °SEEK IMMEDIATE MEDICAL CARE IF:  °· You have severe back pain or lower abdominal pain. °· You develop chills. °· You have nausea or vomiting. °· You have continued burning or discomfort with urination. °MAKE SURE YOU:  °· Understand these instructions. °· Will watch your condition. °· Will get help right away if you are not doing well or get worse. °Document Released: 05/23/2005 Document Revised: 02/12/2012 Document Reviewed: 09/21/2011 °ExitCare® Patient Information ©2014 [employer]. ° °Emergency Department Resource Guide °1) Find a Doctor and Pay Out of Pocket °Although you won't have to find out who is covered by your insurance plan, it is a good idea to ask around and get recommendations. You will then need to call the office and see if the doctor you have chosen will accept you as a new patient and what types of options they offer for patients who are self-pay. Some doctors offer discounts or will set up payment plans for their patients who do not have insurance, but you will need   to ask so you aren't surprised when you get to your appointment. ° °2) Contact Your Local Health Department °Not all health departments have doctors that can see patients for sick visits, but many do, so it is worth a call to see if yours does. If you don't know where your local health department is, you can check in your phone book. The CDC also has a tool to help you locate your  state's health department, and many state websites also have listings of all of their local health departments. ° °3) Find a Walk-in Clinic °If your illness is not likely to be very severe or complicated, you may want to try a walk in clinic. These are popping up all over the country in pharmacies, drugstores, and shopping centers. They're usually staffed by nurse practitioners or physician assistants that have been trained to treat common illnesses and complaints. They're usually fairly quick and inexpensive. However, if you have serious medical issues or chronic medical problems, these are probably not your best option. ° °No Primary Care Doctor: °- Call [facility] at  832-8000 - they can help you locate a primary care doctor that  accepts your insurance, provides certain services, etc. °- Physician Referral Service- 1-800-533-3463 ° °Chronic Pain Problems: °Organization         Address  Phone   Notes  °[facility]  (336) 297-2271 Patients need to be referred by their primary care doctor.  ° °Medication Assistance: °Organization         Address  Phone   Notes  °[place] [facility] [address] °[place], [place] [address] (336) 641-8030 --Must be a resident of [place] °-- Must have NO insurance coverage whatsoever (no Medicaid/ Medicare, etc.) °-- The pt. MUST have a primary care doctor that directs their care regularly and follows them in the community °  °[facility]  (866) 331-1348   °[employer]  (888) 892-1162   ° °Agencies that provide inexpensive medical care: °Organization         Address  Phone   Notes  °[facility]  (336) 832-8035   °[facility]    (336) 832-7272   °[facility] [address] °[place], [place] [address] (336) 832-4777   °[facility] [address], °[place] (336) 271-4999   °[facility]    (336) 373-0678   °[facility]    (336)  272-1050   °[facility] ° [address], [place] Phone:  (336) 832-4444, Fax:  (336) 832-4440 Hours of Operation:  9 am - 6 pm, M-F.  Also accepts Medicaid/Medicare and self-pay.  ° [facility] ° [address], [place] Phone: (336) 832-3150, Fax: (336) 832-3151. Hours of Operation:  8:30 am - 5:30 pm, M-F.  Also accepts Medicaid and self-pay.  °[facility] [place] [address], [place] Phone: (336) 878-6027   °[facility] [address], [place], [place] (336)723-1848, Ext. 123 Mondays & Thursdays: 7-9 AM.  First 15 patients are seen on a first come, first serve basis. °  ° °Medicaid-accepting [place] Providers: ° °Organization         Address  Phone   Notes  °[facility] [address], [place] (336) 641-2100 Also accepts self-pay patients.  °[facility] [address], [place] ° (  336) 856-9996   °[facility] [address], [place] (336) 288-8857   °[facility] [address] [place] [address], [place] (336) 299-7000   °Veita Bland [address], [place]  ° (336) 373-1557 Only accepts [place] Access Medicaid patients after they have their name applied to their card.  ° °Self-Pay (no insurance) in [place]: ° °Organization         Address  Phone   Notes  °Sickle Cell Patients, [facility] [address], [place] (336) 832-1970   °[facility] [address], [place] (336) 832-4400   °[facility] ° [address], [place] (336) 992-4800   °[facility]/Dr. Osei-Bonsu ° [address] [place] [address], [place] or [address], [place] (336) 841-8500 Phone number for both [place] and [place] locations is the same.  °[facility] [address], [place] (336)  299-0000   °[facility] [address] [place] [address], [place] or [address] (336) 852-7530 °(336) 878-2260   °[facility] [address], [place] (336) 350-1642, phone; (336) 294-5005, fax Sees patients 1st and 3rd Saturday of every month.  Must not qualify for public or private insurance (i.e. Medicaid, Medicare, [place] Health Choice, Veterans' Benefits) • Household income should be no more than 200% of the poverty level •The clinic cannot treat you if you are pregnant or think you are pregnant • Sexually transmitted diseases are not treated at the clinic.  ° ° °Dental Care: °Organization         Address  Phone  Notes  °[facility] [address], [place] (336) 641-6152 Accepts children up to age 21 who are enrolled in Medicaid or [place] Health Choice; pregnant women with a Medicaid card; and children who have applied for Medicaid or [place] Health Choice, but were declined, whose parents can pay a reduced fee at time of service.  °[facility] [place]  [address], [place] (336) 641-7733 Accepts children up to age 21 who are enrolled in Medicaid or [place] Health Choice; pregnant women with a Medicaid card; and children who have applied for Medicaid or [place] Health Choice, but were declined, whose parents can pay a reduced fee at time of service.  °[facility] ° [address], [place] (336) 641-4533 Patients are seen by appointment only. Walk-ins are not accepted. [facility] will see patients 18 years of age and older. °Monday - Tuesday (8am-5pm) °Most Wednesdays (8:30-5pm) °$30 per visit, cash only  °[facility] ° [address], [place] (336) 641-4533 Patients are seen by appointment only. Walk-ins are not accepted. [facility] will see patients 18 years of age and older. °One Wednesday Evening (Monthly: Volunteer  Based).  $30 per visit, cash only  °[facility]  (919) 537-3737 for adults; Children under age 4, call Graduate Pediatric Dentistry at (919) [phone]. Children aged 4-14, please call (919) [phone] to request a pediatric application. ° Dental services are provided in all areas of dental care including fillings, crowns and bridges, complete and partial dentures, implants, gum treatment, root canals, and extractions. Preventive care is also provided. Treatment is provided to both adults and children. °Patients are selected via a lottery and there is   often a waiting list. °  °[facility] [address], °[place] ° (336) 763-8833 [email or web address] °  °[facility] [address], [place], [place] (336)723-1848, Ext. 123 Second and Fourth Thursday of each month, opens at 6:30 AM; Clinic ends at 9 AM.  Patients are seen on a first-come first-served basis, and a limited number are seen during each clinic.  ° °[facility] ° [address], [place], [place] (336) 723-7904   Eligibility Requirements °You must have lived in [place], or [place] counties for at least the last three months. °  You cannot be eligible for state or federal sponsored healthcare insurance, including Veterans Administration, Medicaid, or Medicare. °  You generally cannot be eligible for healthcare insurance through your employer.  °  How to apply: °Eligibility screenings are held every Tuesday and Wednesday afternoon from 1:00 pm until 4:00 pm. You do not need an appointment for the interview!  °[facility] [address], [place], [place] 336-631-2330   °[facility]  336-342-8273   °[facility]  336-703-3100   °[facility]  336-570-6415   ° °Behavioral Health Resources in the Community: °Intensive Outpatient Programs °Organization         Address  Phone  Notes  °[facility] [address], [place], [place] 336-878-6098   °[facility] [address], [place], [place] 336-832-9800   °[facility] [address], [place], [place] ° 336-882-2125   °[facility] [address],  °[place], [place] 1-800-853-5163 or 336-641-4981   °Substance Abuse Resources °Organization         Address  Phone  Notes  °[facility]  336-882-2125   °[facility]  336-784-9470   °[facility]  336-285-9073   °[facility]  336-845-3988   °Residential & Outpatient Substance Abuse Program  1-800-659-3381   °Psychological Services °Organization         Address  Phone  Notes  °[facility]  336- 832-9600   °[facility]  336- 378-7881   °[facility] [address], [place] 1-800-853-5163 or 336-641-4981   ° °Mobile Crisis Teams °Organization         Address  Phone  Notes  °[facility], Mobile Crisis Care Unit  1-877-626-1772   °Assertive °[facility] ° [address] [place], [place] 336-834-9664   °Sharon DeEsch [address] °[place] 336-554-5454   ° °Self-Help/Support Groups °Organization         Address  Phone             Notes  °[facility] [place] - variety of support groups  336- 373-1402 Call for more information  °Narcotics Anonymous (NA), [facility] [address], °[place]  2 meetings at this location  ° °Residential Treatment Programs °Organization         Address  Phone  Notes  °[facility] [address],    °[place]  1-866-801-8205   °[facility] ° [address], [place], [place] 704-293-8524   °[facility] [address], [place] 336-845-3988 Admissions: 8am-3pm M-F  °[facility] [address]    °[place], [place] 336-841-1104   °[facility] [address], [place],  336-379-7146    °  [facility] [address]  °[place], [place] 336-285-9073   °[facility] - Intensive Outpatient [address], [place], [place] 336-852-3033   °[facility] [address]  °[place], [place] 1-877-615-2722 or 336-784-9470   °[facility] [address] [place], [place] 336-227-7417 Accepts Medicaid  °[facility] [address]  °[place] 1-800-659-3381 Substance Abuse/Addiction Treatment  ° °[place] Behavioral Health Resources °Organization         Address  Phone  Notes  °[facility]  (888) 581-9988   °Julie Brannon, PhD [address] [place], [place]   (336) 349-5553 or (336) 951-0000   °[facility]   [address] °[place], [place] (336) 349-4454   °[facility] [address], [place], [place] (336) 342-8316 Insurance/Medicaid/sponsorship through [facility]  °[facility] [address]                                    [place], [place] (336) 342-8316 Therapy/tele-psych/case  °[facility] [address]  ° [place], [place] (336) 349-2233    °Dr. Arfeen  (336) 349-4544   °[facility] [place]  [employer] [place] [facility] 1) [address], [place] °2) [address], [place] °3)  [address], [place] (336) 349-3220 °(336) 342-7768 ° °(336) 342-8140   °[place] [facility] (336) 342-1394 or (336) 342-3537 (After Hours)    ° ° ° °

## 2013-10-10 LAB — URINE CULTURE: Colony Count: >=100000

## 2013-10-11 ENCOUNTER — Telehealth (HOSPITAL_COMMUNITY): Payer: Self-pay | Admitting: Emergency Medicine

## 2013-10-11 NOTE — ED Notes (Signed)
Post ED Visit - Positive Culture Follow-up  Culture report reviewed by antimicrobial stewardship pharmacist: []  Wes Dulaney, Pharm.D., BCPS []  Celedonio MiyamotoJeremy Frens, Pharm.D., BCPS []  Georgina PillionElizabeth Martin, [address] BCPS []  StandardMinh Pham, [address]D., BCPS, AAHIVP []  Estella HuskMichelle Turner, Pharm.D., BCPS, AAHIVP [x]  Harland GermanAndrew Meyer, Pharm.D., BCPS  Positive urine culture Treated with Cipro, organism sensitive to the same and no further patient follow-up is required at this time.  Zeb ComfortHolland, Daquana Paddock 10/11/2013, 12:27 PM

## 2013-10-13 NOTE — ED Provider Notes (Signed)
CSN: [id number]     Arrival date & time 10/08/13  [id number] History   First MD Initiated Contact with Patient 10/08/13 0912     Chief Complaint  Patient presents with  . Abdominal Pain     (Consider location/radiation/quality/duration/timing/severity/associated sxs/prior Treatment) HPI   28 year old female with lower abdominal pressure dysuria. Symptom onset about 2 days ago and progressively worsening.Constant. Increased pressure when she urinates. No hematuria. No unusual vaginal bleeding or discharge. No back pain. Has it, but no vomiting. No diarrhea. No intervention prior to arrival. History reviewed. No pertinent past medical history. History reviewed. No pertinent past surgical history. No family history on file. History  Substance Use Topics  . Smoking status: Never Smoker   . Smokeless tobacco: Not on file  . Alcohol Use: Yes   OB History   Grav Para Term Preterm Abortions TAB SAB Ect Mult Living   1              Review of Systems  All systems reviewed and negative, other than as noted in HPI.   Allergies  Aleve  Home Medications   Current Outpatient Rx  Name  Route  Sig  Dispense  Refill  . ciprofloxacin (CIPRO) 500 MG tablet   Oral   Take 1 tablet (500 mg total) by mouth every 12 (twelve) hours.   6 tablet   0   . oxyCODONE-acetaminophen (PERCOCET/ROXICET) 5-325 MG per tablet   Oral   Take 1 tablet by mouth every 4 (four) hours as needed for severe pain.   8 tablet   0    BP 110/73  Pulse 85  Temp(Src) 98.2 F (36.8 C) (Oral)  Resp 18  Ht 5\' 8"  (1.727 m)  Wt 125 lb (56.7 kg)  BMI 19.01 kg/m2  SpO2 100%  LMP 09/17/2013 Physical Exam  Nursing note and vitals reviewed. Constitutional: She appears well-developed and well-nourished. No distress.  HENT:  Head: Normocephalic and atraumatic.  Eyes: Conjunctivae are normal. Right eye exhibits no discharge. Left eye exhibits no discharge.  Neck: Neck supple.  Cardiovascular: Normal rate, regular  rhythm and normal heart sounds.  Exam reveals no gallop and no friction rub.   No murmur heard. Pulmonary/Chest: Effort normal and breath sounds normal. No respiratory distress.  Abdominal: Soft. She exhibits no distension. There is tenderness.  Mild suprapubic tenderness without rebound or guarding. No distention.  Genitourinary:  No costovertebral angle tenderness  Musculoskeletal: She exhibits no edema and no tenderness.  Neurological: She is alert.  Skin: Skin is warm and dry.  Psychiatric: She has a normal mood and affect. Her behavior is normal. Thought content normal.    ED Course  Procedures (including critical care time) Labs Review Labs Reviewed  URINALYSIS, ROUTINE W REFLEX MICROSCOPIC - Abnormal; Notable for the following:    APPearance TURBID (*)    Hgb urine dipstick LARGE (*)    Protein, ur 100 (*)    Nitrite POSITIVE (*)    Leukocytes, UA LARGE (*)    All other components within normal limits  URINE MICROSCOPIC-ADD ON - Abnormal; Notable for the following:    Squamous Epithelial / LPF FEW (*)    Bacteria, UA MANY (*)    Casts WBC CAST (*)    All other components within normal limits  URINE CULTURE  PREGNANCY, URINE   Imaging Review No results found.  EKG Interpretation   None       MDM   Final diagnoses:  UTI (urinary tract infection)  28 year old female with symptoms consistent with cystitis. Urinalysis is consistent with this as well. Non-peritoneal. Hemodynamically stable. No vomiting.    Raeford Razor, MD 10/13/13 [phone]

## 2014-01-05 ENCOUNTER — Emergency Department (HOSPITAL_COMMUNITY)
Admission: EM | Admit: 2014-01-05 | Discharge: 2014-01-05 | Disposition: A | Discharge status: Home / Self Care | Payer: Medicaid Other | Attending: Emergency Medicine | Admitting: Emergency Medicine

## 2014-01-05 ENCOUNTER — Encounter (HOSPITAL_COMMUNITY): Payer: Self-pay | Admitting: Emergency Medicine

## 2014-01-05 DIAGNOSIS — Y9389 Activity, other specified: Secondary | ICD-10-CM | POA: Insufficient documentation

## 2014-01-05 DIAGNOSIS — M542 Cervicalgia: Secondary | ICD-10-CM

## 2014-01-05 DIAGNOSIS — Z792 Long term (current) use of antibiotics: Secondary | ICD-10-CM | POA: Insufficient documentation

## 2014-01-05 DIAGNOSIS — R51 Headache: Secondary | ICD-10-CM | POA: Insufficient documentation

## 2014-01-05 DIAGNOSIS — S199XXA Unspecified injury of neck, initial encounter: Principal | ICD-10-CM

## 2014-01-05 DIAGNOSIS — S3981XA Other specified injuries of abdomen, initial encounter: Secondary | ICD-10-CM | POA: Insufficient documentation

## 2014-01-05 DIAGNOSIS — S0993XA Unspecified injury of face, initial encounter: Secondary | ICD-10-CM | POA: Insufficient documentation

## 2014-01-05 DIAGNOSIS — R1110 Vomiting, unspecified: Secondary | ICD-10-CM | POA: Insufficient documentation

## 2014-01-05 DIAGNOSIS — Y92410 Unspecified street and highway as the place of occurrence of the external cause: Secondary | ICD-10-CM | POA: Insufficient documentation

## 2014-01-05 MED ORDER — CYCLOBENZAPRINE HCL 10 MG PO TABS: 10.0000 mg | ORAL_TABLET | Freq: Two times a day (BID) | ORAL | Status: DC | PRN

## 2014-01-05 MED ORDER — TRAMADOL HCL 50 MG PO TABS: 50.0000 mg | ORAL_TABLET | Freq: Four times a day (QID) | ORAL | Status: DC | PRN
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
## 2014-01-05 NOTE — Discharge Instructions (Signed)
Take Tramadol as needed for pain. Take Flexeril as needed for muscle spasm. Refer to attached documents for more information. Apply heat to the affected area.

## 2014-01-05 NOTE — ED Provider Notes (Signed)
Medical screening examination/treatment/procedure(s) were performed by non-physician practitioner and as supervising physician I was immediately available for consultation/collaboration.    Shanna CiscoMegan E Docherty, MD 01/05/14 2214

## 2014-06-28 ENCOUNTER — Encounter (HOSPITAL_COMMUNITY): Payer: Self-pay | Admitting: Emergency Medicine

## 2015-01-17 ENCOUNTER — Emergency Department (HOSPITAL_COMMUNITY)
Admission: EM | Admit: 2015-01-17 | Discharge: 2015-01-18 | Disposition: A | Discharge status: Home / Self Care | Payer: Medicaid Other | Attending: Emergency Medicine | Admitting: Emergency Medicine

## 2015-01-17 ENCOUNTER — Other Ambulatory Visit: Payer: Self-pay

## 2015-01-17 ENCOUNTER — Encounter (HOSPITAL_COMMUNITY): Payer: Self-pay | Admitting: Emergency Medicine

## 2015-01-17 DIAGNOSIS — R1013 Epigastric pain: Secondary | ICD-10-CM | POA: Insufficient documentation

## 2015-01-17 DIAGNOSIS — Z3A01 Less than 8 weeks gestation of pregnancy: Secondary | ICD-10-CM | POA: Diagnosis not present

## 2015-01-17 DIAGNOSIS — O210 Mild hyperemesis gravidarum: Secondary | ICD-10-CM | POA: Insufficient documentation

## 2015-01-17 DIAGNOSIS — O211 Hyperemesis gravidarum with metabolic disturbance: Secondary | ICD-10-CM

## 2015-01-17 DIAGNOSIS — O4690 Antepartum hemorrhage, unspecified, unspecified trimester: Secondary | ICD-10-CM

## 2015-01-17 DIAGNOSIS — E876 Hypokalemia: Secondary | ICD-10-CM

## 2015-01-17 DIAGNOSIS — R0602 Shortness of breath: Secondary | ICD-10-CM | POA: Insufficient documentation

## 2015-01-17 DIAGNOSIS — R42 Dizziness and giddiness: Secondary | ICD-10-CM | POA: Insufficient documentation

## 2015-01-17 DIAGNOSIS — O26899 Other specified pregnancy related conditions, unspecified trimester: Secondary | ICD-10-CM

## 2015-01-17 DIAGNOSIS — R0789 Other chest pain: Secondary | ICD-10-CM | POA: Diagnosis not present

## 2015-01-17 DIAGNOSIS — O9989 Other specified diseases and conditions complicating pregnancy, childbirth and the puerperium: Secondary | ICD-10-CM | POA: Diagnosis not present

## 2015-01-17 DIAGNOSIS — O2391 Unspecified genitourinary tract infection in pregnancy, first trimester: Secondary | ICD-10-CM | POA: Diagnosis not present

## 2015-01-17 DIAGNOSIS — E860 Dehydration: Secondary | ICD-10-CM | POA: Diagnosis not present

## 2015-01-17 DIAGNOSIS — R05 Cough: Secondary | ICD-10-CM | POA: Insufficient documentation

## 2015-01-17 DIAGNOSIS — O99281 Endocrine, nutritional and metabolic diseases complicating pregnancy, first trimester: Secondary | ICD-10-CM | POA: Insufficient documentation

## 2015-01-17 DIAGNOSIS — O209 Hemorrhage in early pregnancy, unspecified: Secondary | ICD-10-CM | POA: Diagnosis not present

## 2015-01-17 DIAGNOSIS — R109 Unspecified abdominal pain: Secondary | ICD-10-CM

## 2015-01-17 LAB — CBC WITH DIFFERENTIAL/PLATELET
BASOS ABS: 0 10*3/uL (ref 0.0–0.1)
BASOS PCT: 0 % (ref 0–1)
EOS ABS: 0 10*3/uL (ref 0.0–0.7)
EOS PCT: 0 % (ref 0–5)
HEMATOCRIT: 38.1 % (ref 36.0–46.0)
Hemoglobin: 13.9 g/dL (ref 12.0–15.0)
Lymphocytes Relative: 22 % (ref 12–46)
Lymphs Abs: 2 10*3/uL (ref 0.7–4.0)
MCH: 31.8 pg (ref 26.0–34.0)
MCHC: 36.5 g/dL — AB (ref 30.0–36.0)
MCV: 87.2 fL (ref 78.0–100.0)
MONOS PCT: 14 % — AB (ref 3–12)
Monocytes Absolute: 1.2 10*3/uL — ABNORMAL HIGH (ref 0.1–1.0)
NEUTROS ABS: 5.7 10*3/uL (ref 1.7–7.7)
NEUTROS PCT: 64 % (ref 43–77)
Platelets: 198 10*3/uL (ref 150–400)
RBC: 4.37 MIL/uL (ref 3.87–5.11)
RDW: 12 % (ref 11.5–15.5)
WBC: 8.9 10*3/uL (ref 4.0–10.5)

## 2015-01-17 LAB — COMPREHENSIVE METABOLIC PANEL
ALT: 28 U/L (ref 14–54)
AST: 27 U/L (ref 15–41)
Albumin: 4.9 g/dL (ref 3.5–5.0)
Alkaline Phosphatase: 27 U/L — ABNORMAL LOW (ref 38–126)
Anion gap: 14 (ref 5–15)
BUN: 9 mg/dL (ref 6–20)
CALCIUM: 9.9 mg/dL (ref 8.9–10.3)
CHLORIDE: 97 mmol/L — AB (ref 101–111)
CO2: 23 mmol/L (ref 22–32)
CREATININE: 0.77 mg/dL (ref 0.44–1.00)
GFR calc non Af Amer: >60 mL/min (ref >60)
Glucose, Bld: 124 mg/dL — ABNORMAL HIGH (ref 65–99)
Potassium: 3.3 mmol/L — ABNORMAL LOW (ref 3.5–5.1)
Sodium: 134 mmol/L — ABNORMAL LOW (ref 135–145)
TOTAL PROTEIN: 8.4 g/dL — AB (ref 6.5–8.1)
Total Bilirubin: 1 mg/dL (ref 0.3–1.2)

## 2015-01-17 LAB — I-STAT TROPONIN, ED: Troponin i, poc: 0 ng/mL (ref 0.00–0.08)

## 2015-01-17 MED ORDER — SODIUM CHLORIDE 0.9 % IV BOLUS (SEPSIS)
1000.0000 mL | Freq: Once | INTRAVENOUS | Status: AC
  Administered 2015-01-18: 1000 mL via INTRAVENOUS

## 2015-01-17 MED ORDER — ACETAMINOPHEN 325 MG PO TABS
650.0000 mg | ORAL_TABLET | Freq: Once | ORAL | Status: AC
  Administered 2015-01-18: 650 mg via ORAL
  Filled 2015-01-17: qty 2

## 2015-01-17 MED ORDER — ONDANSETRON HCL 4 MG/2ML IJ SOLN
4.0000 mg | Freq: Once | INTRAMUSCULAR | Status: AC
  Administered 2015-01-18: 4 mg via INTRAVENOUS
  Filled 2015-01-17: qty 2

## 2015-01-17 MED ORDER — LIDOCAINE VISCOUS 2 % MT SOLN
15.0000 mL | Freq: Once | OROMUCOSAL | Status: AC
  Administered 2015-01-18: 15 mL via OROMUCOSAL
  Filled 2015-01-17: qty 15

## 2015-01-17 NOTE — ED Provider Notes (Signed)
CSN: [id number]     Arrival date & time 01/17/15  2155 History   First MD Initiated Contact with Patient 01/17/15 2311     Chief Complaint  Patient presents with  . Chest Pain    The patient said she is 8weeks pregnatn and has been having nausea and vomiting.  She has lost count on how many times she has vomited.    . Morning Sickness  . Emesis During Pregnancy     (Consider location/radiation/quality/duration/timing/severity/associated sxs/prior Treatment) HPI Comments: Eshika Reckart is a 29 y.o. G39P2 female who presents to the ED with multiple complaints. She states that for the last 3 days she has been nauseous and vomiting, stating that she has vomited over 20 times, that intermittently she has streaks of red in the emesis, but denies any coffee-ground emesis or large-volume gross hematemesis. After she was vomiting the first day, she developed 10/10 constant burning and cramping central chest pain that is nonradiating, worsens with vomiting, and with no known alleviating factors given that she has not tried anything for pain. Additional symptoms include lower abdominal cramping, epigastric abdominal pain, scant bright red vaginal bleeding 2 days, shortness of breath, dry cough intermittently, lightheadedness, and myalgias. She is difficult to obtain history from due to the fact that she is dry heaving during the history taking, but she is able to tell me that she has had no recent fevers or chills, wheezing, leg swelling, recent travel/surgery/immobilization, history of DVT/PE, hemoptysis, diarrhea, constipation, hematochezia, melena, dysuria, hematuria, URI symptoms, vision changes, headache, vaginal discharge, numbness, tailing, or weakness. No sick contacts. She has had 2 prior healthy pregnancies, and 2 elective abortions. LMP was 11/24/14. She has had issues with n/v in pregnancy in the past. Feels very dehydrated and states "everything hurts".   Patient is a 29 y.o. female presenting  with chest pain. The history is provided by the patient. No language interpreter was used.  Chest Pain Pain location:  Epigastric and substernal area Pain quality: burning   Pain radiates to:  Does not radiate Pain radiates to the back: no   Pain severity:  Severe Onset quality:  Gradual Duration:  3 days Timing:  Constant Progression:  Unchanged Chronicity:  New Context: at rest   Relieved by:  None tried Exacerbated by: vomiting. Ineffective treatments:  None tried Associated symptoms: abdominal pain (epigastric and lower), cough, heartburn, nausea, shortness of breath and vomiting   Associated symptoms: no claudication, no dizziness, no fever, no lower extremity edema, no near-syncope, no numbness, no orthopnea, no PND, no syncope and no weakness   Risk factors: pregnancy   Risk factors: no birth control, no diabetes mellitus, no high cholesterol, no hypertension, no immobilization, no prior DVT/PE, no smoking and no surgery     History reviewed. No pertinent past medical history. History reviewed. No pertinent past surgical history. History reviewed. No pertinent family history. History  Substance Use Topics  . Smoking status: Never Smoker   . Smokeless tobacco: Not on file  . Alcohol Use: Yes   OB History    Gravida Para Term Preterm AB TAB SAB Ectopic Multiple Living   1              Review of Systems  Constitutional: Negative for fever and chills.  HENT: Negative for rhinorrhea and sore throat.   Eyes: Negative for visual disturbance.  Respiratory: Positive for cough and shortness of breath. Negative for wheezing.   Cardiovascular: Positive for chest pain. Negative for orthopnea, claudication,  leg swelling, syncope, PND and near-syncope.  Gastrointestinal: Positive for heartburn, nausea, vomiting and abdominal pain (epigastric and lower). Negative for diarrhea, constipation and blood in stool.  Genitourinary: Positive for vaginal bleeding (scant, x2 days). Negative  for dysuria, hematuria, flank pain and vaginal discharge.  Musculoskeletal: Positive for myalgias. Negative for arthralgias.  Skin: Negative for rash.  Allergic/Immunologic: Positive for immunocompromised state (pregnant).  Neurological: Positive for light-headedness. Negative for dizziness, syncope, weakness and numbness.  Psychiatric/Behavioral: Negative for confusion.   10 Systems reviewed and are negative for acute change except as noted in the HPI.    Allergies  Aleve  Home Medications   Prior to Admission medications   Medication Sig Start Date End Date Taking? Authorizing Provider  acetaminophen (TYLENOL) 500 MG tablet Take 1,500 mg by mouth every 6 (six) hours as needed for moderate pain.    Historical Provider, MD  cyclobenzaprine (FLEXERIL) 10 MG tablet Take 1 tablet (10 mg total) by mouth 2 (two) times daily as needed for muscle spasms. 01/05/14   Kaitlyn Szekalski, PA-C  traMADol (ULTRAM) 50 MG tablet Take 1 tablet (50 mg total) by mouth every 6 (six) hours as needed. 01/05/14   Kaitlyn Szekalski, PA-C   BP 125/77 mmHg  Pulse 85  Resp 25  SpO2 97%  LMP 11/24/2014 Physical Exam  Constitutional: She is oriented to person, place, and time. Vital signs are normal. She appears well-developed and well-nourished.  Non-toxic appearance. She appears distressed.  Nontoxic, appears distressed dry heaving and pleading for help with her nausea, very dry mucous membranes Temperature not obtained yet, but pt does not feel warm to the touch  HENT:  Head: Normocephalic and atraumatic.  Mouth/Throat: Mucous membranes are dry.  Very dry mucous membranes  Eyes: Conjunctivae and EOM are normal. Pupils are equal, round, and reactive to light. Right eye exhibits no discharge. Left eye exhibits no discharge.  Neck: Normal range of motion. Neck supple.  Cardiovascular: Normal rate, regular rhythm, normal heart sounds and intact distal pulses.  Exam reveals no gallop and no friction rub.   No  murmur heard. RRR, nl s1/s2, no m/r/g, distal pulses intact, no pedal edema   Pulmonary/Chest: Effort normal and breath sounds normal. No respiratory distress. She has no decreased breath sounds. She has no wheezes. She has no rhonchi. She has no rales. She exhibits tenderness. She exhibits no crepitus, no deformity and no retraction.    CTAB in all lung fields, no w/r/r, no hypoxia or increased WOB, speaking in full sentences, SpO2 97% on RA Chest wall mildly TTP extending from epigastrum into central sternal area, no crepitus or deformity, no retractions.  Abdominal: Soft. Normal appearance and bowel sounds are normal. She exhibits no distension. There is tenderness in the right lower quadrant, epigastric area, suprapubic area and left lower quadrant. There is no rigidity, no rebound, no guarding, no CVA tenderness, no tenderness at McBurney's point and negative Murphy's sign.    Soft, nondistended, +BS throughout, with epigastric TTP and lower abd TTP diffusely along the pelvic brim, no r/g/r, neg murphy's, neg mcburney's, no CVA TTP   Genitourinary: There is no rash, tenderness or lesion on the right labia. There is no rash, tenderness or lesion on the left labia. Uterus is tender. Uterus is not deviated, not enlarged and not fixed. Cervix exhibits no motion tenderness, no discharge and no friability. Right adnexum displays tenderness. Right adnexum displays no mass and no fullness. Left adnexum displays tenderness. Left adnexum displays no mass and  no fullness. No erythema or bleeding in the vagina. Vaginal discharge (thin clear) found.  Chaperone present for exam. No rashes, lesions, or tenderness to external genitalia. No erythema, injury, or tenderness to vaginal mucosa. Scant thin clear vaginal discharge without bleeding within vaginal vault. No adnexal masses or fullness with b/l adnexal tenderness. No CMT, cervical friability, or discharge from cervical os, which is closed. Uterus  non-deviated, mobile, and without enlargement but with diffuse tenderness.  Musculoskeletal: Normal range of motion.  MAE x4 Strength and sensation grossly intact Distal pulses intact No pedal edema, neg homan's bilaterally   Neurological: She is alert and oriented to person, place, and time. She has normal strength. No sensory deficit.  Does not cooperate fully with thorough neurologic exam, but no focal deficits noted during exam. Steady gait, strength and sensation intact. A&O x4. GCS 15  Skin: Skin is warm, dry and intact. No rash noted.  Psychiatric: She has a normal mood and affect.  Nursing note and vitals reviewed.   ED Course  Procedures (including critical care time) Labs Review Labs Reviewed  CBC WITH DIFFERENTIAL/PLATELET - Abnormal; Notable for the following:    MCHC 36.5 (*)    Monocytes Relative 14 (*)    Monocytes Absolute 1.2 (*)    All other components within normal limits  COMPREHENSIVE METABOLIC PANEL - Abnormal; Notable for the following:    Sodium 134 (*)    Potassium 3.3 (*)    Chloride 97 (*)    Glucose, Bld 124 (*)    Total Protein 8.4 (*)    Alkaline Phosphatase 27 (*)    All other components within normal limits  URINALYSIS, ROUTINE W REFLEX MICROSCOPIC  I-STAT TROPOININ, ED  POC URINE PREG, ED    Imaging Review No results found.   EKG Interpretation None      MDM   Final diagnoses:  Vaginal bleeding in pregnancy  Hyperemesis gravidarum before end of [redacted] week gestation, dehydration  Hypokalemia  Abdominal pain during pregnancy  Atypical chest pain  Dehydration    29 y.o. female currently pregnant, here with multiple complaints but biggest complaint is n/v x3 days, no gross hematemesis but some red streaks occasionally. Appears very dehydrated, dry mucous membranes, likely hyperemesis gravidarum. Additionally states her whole body hurts, including a burning/cramping in her chest with SOB and intermittent dry cough, lightheadedness, and  lower abd cramping with scant vaginal bleeding x2 days. CP reproducible, and seems to be from epigastrum and likely from vomiting causing esophagitis. Abd exam reveals mild lower abd tenderness, and epigastric tenderness. Nonperitoneal. No hypoxia or tachycardia, doubt PE, but this could be on the differential if she became hypoxic. Will obtain pelvic exam and early pregnancy vaginal bleeding panel. So far labs showing no leukocytosis, CMP showing mildly low K. Trop neg. EKG not yet performed. Will hold off on CXR since pt is pregnant and lung sounds are clear without any peripheral edema. Will give fluids, tylenol, zofran, and viscous lidocaine. Will reassess shortly. Will request that pt have temp obtained since this is relevant to her current state.   1:01 AM Pelvic exam with physiologic d/c and diffuse tenderness throughout. Wet prep with few WBC but otherwise unremarkable. ABO/Rh O+. EKG still not performed, will inquire. Temp still not obtained, pt in u/s, will have them get this immediately upon her return. Lipase and quant HCG still pending. U/S in process. At this time will sign over care to Harle Battiest NP who will f/up with results, temperature reading,  EKG, and dispo appropriately. If pt unable to tolerate PO, would need to stay for IV hydration. Otherwise, if all results unremarkable, could f/up with her obgyn in 2 days. Would need potassium x1 dose for home, and would likely benefit from vitamin B6 and unisom. Please see Waynetta SandyBeth Tysinger's note for further documentation of care.  BP 117/63 mmHg  Pulse 91  Resp 19  SpO2 99%  LMP 11/24/2014  Meds ordered this encounter  Medications  . sodium chloride 0.9 % bolus 1,000 mL    Sig:   . ondansetron (ZOFRAN) injection 4 mg    Sig:   . acetaminophen (TYLENOL) tablet 650 mg    Sig:   . lidocaine (XYLOCAINE) 2 % viscous mouth solution 15 mL    Sig:   . sodium chloride 0.9 % bolus 1,000 mL    Sig:      Izzabell Klasen Camprubi-Soms,  PA-C 01/18/15 0116  Donnetta HutchingBrian Cook, MD 01/19/15 1133

## 2015-01-17 NOTE — ED Notes (Signed)
The patient said she is 8weeks pregnatn and has been having nausea and vomiting.  She has lost count on how many times she has vomited.  She also says she is SOB, dizzy, lightheaded and feels like she is going to pass out.  She advised that she could not take it anymore so she decided to come in.  She rates her pain 10/10.

## 2015-01-18 ENCOUNTER — Emergency Department (HOSPITAL_COMMUNITY): Payer: Medicaid Other

## 2015-01-18 LAB — URINALYSIS, ROUTINE W REFLEX MICROSCOPIC
BILIRUBIN URINE: NEGATIVE
Glucose, UA: NEGATIVE mg/dL
Hgb urine dipstick: NEGATIVE
KETONES UR: 40 mg/dL — AB
LEUKOCYTES UA: NEGATIVE
Nitrite: NEGATIVE
PROTEIN: NEGATIVE mg/dL
SPECIFIC GRAVITY, URINE: 1.026 (ref 1.005–1.030)
Urobilinogen, UA: 0.2 mg/dL (ref 0.0–1.0)
pH: 5 (ref 5.0–8.0)

## 2015-01-18 LAB — WET PREP, GENITAL
Clue Cells Wet Prep HPF POC: NONE SEEN
Trich, Wet Prep: NONE SEEN
Yeast Wet Prep HPF POC: NONE SEEN

## 2015-01-18 LAB — GC/CHLAMYDIA PROBE AMP (~~LOC~~) NOT AT ARMC
CHLAMYDIA, DNA PROBE: NEGATIVE
Neisseria Gonorrhea: NEGATIVE

## 2015-01-18 LAB — ABO/RH: ABO/RH(D): O POS

## 2015-01-18 LAB — RPR: RPR: NONREACTIVE

## 2015-01-18 LAB — HIV ANTIBODY (ROUTINE TESTING W REFLEX): HIV SCREEN 4TH GENERATION: NONREACTIVE

## 2015-01-18 LAB — LIPASE, BLOOD: Lipase: 15 U/L — ABNORMAL LOW (ref 22–51)

## 2015-01-18 LAB — HCG, QUANTITATIVE, PREGNANCY: HCG, BETA CHAIN, QUANT, S: 65333 m[IU]/mL — AB (ref <5)

## 2015-01-18 MED ORDER — ACETAMINOPHEN 325 MG PO TABS
650.0000 mg | ORAL_TABLET | Freq: Once | ORAL | Status: AC
  Administered 2015-01-18: 650 mg via ORAL
  Filled 2015-01-18: qty 2

## 2015-01-18 MED ORDER — PROMETHAZINE HCL 25 MG RE SUPP: 25.0000 mg | Freq: Four times a day (QID) | RECTAL | Status: DC | PRN

## 2015-01-18 MED ORDER — SODIUM CHLORIDE 0.9 % IV BOLUS (SEPSIS): 1000.0000 mL | INTRAVENOUS | Status: DC

## 2015-01-18 MED ORDER — SODIUM CHLORIDE 0.9 % IV BOLUS (SEPSIS)
1000.0000 mL | Freq: Once | INTRAVENOUS | Status: AC
  Administered 2015-01-18: 1000 mL via INTRAVENOUS

## 2015-01-18 NOTE — ED Provider Notes (Signed)
1:00 AM: At end of shift, hand off report received from [facility], PA-C. Plan includes follow OB US and re-eval after labs and IVF. Pt currently resting without distress.   1:30 AM: [place] results reviewed showing IUP at [redacted]w[redacted]d and no abnormalities. She also has a robust Beta hCG consistent with that gestation. Encouraged RN to increase rate of fluid administration to facilitate re-hydration and urine collection.   3:30 AM: UA reviewed, no leukocytes or nitrites, but with 40 ketones consistent with dehydration.  Pt receiving 2nd liter NS.   5:15 AM:Pt reports feeling better, still mildly nauseated. Pt re-evaled, clarified the report of initial chest pain pt describes it as epigastric pain that radiates up her throat after vomiting. She is tolerating POs without difficulty.  Pt is well-appearing, in no acute distress and vital signs reviewed and not concerning. She appears safe to be discharged.  Discharge include follow-up with her OB/GYN.  Return precautions provided. Pt aware of plan and in agreement.   Filed Vitals:   01/18/15 0155 01/18/15 0215 01/18/15 0403 01/18/15 0517  BP:  112/60 104/51 112/68  Pulse:  78 77 78  Temp: 98.9 F (37.2 C)     TempSrc: Oral     Resp:  SpO2:  98% 100% 99%   Meds given in ED:  Medications  sodium chloride 0.9 % bolus 1,000 mL (0 mLs Intravenous Stopped 01/18/15 0228)  ondansetron (ZOFRAN) injection 4 mg (4 mg Intravenous Given 01/18/15 0025)  acetaminophen (TYLENOL) tablet 650 mg (650 mg Oral Given 01/18/15 0023)  lidocaine (XYLOCAINE) 2 % viscous mouth solution 15 mL (15 mLs Mouth/Throat Given 01/18/15 0026)  sodium chloride 0.9 % bolus 1,000 mL (0 mLs Intravenous Stopped 01/18/15 0516)  acetaminophen (TYLENOL) tablet 650 mg (650 mg Oral Given 01/18/15 0411)    Discharge Medication List as of 01/18/2015  5:00 AM    START taking these medications   Details  promethazine (PHENERGAN) 25 MG suppository Place 1 suppository (25 mg total)  rectally every 6 (six) hours as needed for nausea or vomiting., Starting 01/18/2015, Until Discontinued, Print       Results for orders placed or performed during the hospital encounter of 01/17/15  Wet prep, genital  Result Value Ref Range   Yeast Wet Prep HPF POC NONE SEEN NONE SEEN   Trich, Wet Prep NONE SEEN NONE SEEN   Clue Cells Wet Prep HPF POC NONE SEEN NONE SEEN   WBC, Wet Prep HPF POC FEW (A) NONE SEEN  CBC with Differential  Result Value Ref Range   WBC 8.9 4.0 - 10.5 K/uL   RBC 4.37 3.87 - 5.11 MIL/uL   Hemoglobin 13.9 12.0 - 15.0 g/dL   HCT 16.1 09.6 - 04.5 %   MCV 87.2 78.0 - 100.0 fL   MCH 31.8 26.0 - 34.0 pg   MCHC 36.5 (H) 30.0 - 36.0 g/dL   RDW 40.9 81.1 - 91.4 %   Platelets 198 150 - 400 K/uL   Neutrophils Relative % 64 43 - 77 %   Neutro Abs 5.7 1.7 - 7.7 K/uL   Lymphocytes Relative 22 12 - 46 %   Lymphs Abs 2.0 0.7 - 4.0 K/uL   Monocytes Relative 14 (H) 3 - 12 %   Monocytes Absolute 1.2 (H) 0.1 - 1.0 K/uL   Eosinophils Relative 0 0 - 5 %   Eosinophils Absolute 0.0 0.0 - 0.7 K/uL   Basophils Relative 0 0 - 1 %   Basophils  Absolute 0.0 0.0 - 0.1 K/uL  Comprehensive metabolic panel  Result Value Ref Range   Sodium 134 (L) 135 - 145 mmol/L   Potassium 3.3 (L) 3.5 - 5.1 mmol/L   Chloride 97 (L) 101 - 111 mmol/L   CO2 23 22 - 32 mmol/L   Glucose, Bld 124 (H) 65 - 99 mg/dL   BUN 9 6 - 20 mg/dL   Creatinine, Ser 0.980.77 0.44 - 1.00 mg/dL   Calcium 9.9 8.9 - 11.910.3 mg/dL   Total Protein 8.4 (H) 6.5 - 8.1 g/dL   Albumin 4.9 3.5 - 5.0 g/dL   AST 27 15 - 41 U/L   ALT 28 14 - 54 U/L   Alkaline Phosphatase 27 (L) 38 - 126 U/L   Total Bilirubin 1.0 0.3 - 1.2 mg/dL   GFR calc non Af Amer >60 >60 mL/min   GFR calc Af Amer >60 >60 mL/min   Anion gap 14 5 - 15  Urinalysis, Routine w reflex microscopic  Result Value Ref Range   Color, Urine AMBER (A) YELLOW   APPearance CLOUDY (A) CLEAR   Specific Gravity, Urine 1.026 1.005 - 1.030   pH 5.0 5.0 - 8.0   Glucose, UA  NEGATIVE NEGATIVE mg/dL   Hgb urine dipstick NEGATIVE NEGATIVE   Bilirubin Urine NEGATIVE NEGATIVE   Ketones, ur 40 (A) NEGATIVE mg/dL   Protein, ur NEGATIVE NEGATIVE mg/dL   Urobilinogen, UA 0.2 0.0 - 1.0 mg/dL   Nitrite NEGATIVE NEGATIVE   Leukocytes, UA NEGATIVE NEGATIVE  hCG, quantitative, pregnancy  Result Value Ref Range   hCG, Beta Chain, Quant, S 65333 (H) <5 mIU/mL  HIV antibody  Result Value Ref Range   HIV Screen 4th Generation wRfx Non Reactive Non Reactive  RPR  Result Value Ref Range   RPR Ser Ql Non Reactive Non Reactive  Lipase, blood  Result Value Ref Range   Lipase 15 (L) 22 - 51 U/L  I-stat troponin, ED (only if pt is 31 y.o. or older & pain is above umbilicus)  not at [facility], [facility]  Result Value Ref Range   Troponin i, poc 0.00 0.00 - 0.08 ng/mL   Comment 3          ABO/Rh  Result Value Ref Range   ABO/RH(D) O POS    No rh immune globuloin NOT A RH IMMUNE GLOBULIN CANDIDATE, PT RH POSITIVE   GC/Chlamydia probe amp ([place])  Result Value Ref Range   Chlamydia Negative    Neisseria gonorrhea Negative    [place] Ob Comp Less 14 Wks  01/18/2015   CLINICAL DATA:  Vaginal bleeding.  EXAM: OBSTETRIC <14 WK US AND TRANSVAGINAL OB US  TECHNIQUE: Both transabdominal and transvaginal ultrasound examinations were performed for complete evaluation of the gestation as well as the maternal uterus, adnexal regions, and pelvic cul-de-sac. Transvaginal technique was performed to assess early pregnancy.  COMPARISON:  None.  FINDINGS: Intrauterine gestational sac: Single normal-appearing intrauterine gestational sac.  Yolk sac:  Visualized  Embryo:  Visualized  Cardiac Activity: Visualized  Heart Rate: 131  bpm  CRL:  8.7  mm   6 w   6 d                  US EDC: 09/07/2015  Maternal uterus/adnexae:  Normal appearance of the anteverted uterus. No discrete uterine mass.  Normal size and appearance of the right ovary, measuring approximately 3.4 x 2.3 x 2.5 cm. No discrete right-sided  ovarian or adnexal  lesions.  Normal size and appearance of the left ovary measuring approximately 3.5 x 2.0 x 2.9 cm. Note is made of 2 adjacent anechoic left-sided corpus luteal cysts with dominant cyst measuring approximately 1.9 x 1.3 x 1.3 cm.  No free fluid within the pelvic cul-de-sac.  IMPRESSION: Single viable intrauterine gestation with crown rump length compatible with 6 weeks, 6 days gestation and estimated delivery date of 09/07/2015   Electronically Signed   By: Simonne Come M.D.   On: 01/18/2015 01:23   US Ob Transvaginal  01/18/2015   CLINICAL DATA:  Vaginal bleeding.  EXAM: OBSTETRIC <14 WK [place] AND TRANSVAGINAL OB US  TECHNIQUE: Both transabdominal and transvaginal ultrasound examinations were performed for complete evaluation of the gestation as well as the maternal uterus, adnexal regions, and pelvic cul-de-sac. Transvaginal technique was performed to assess early pregnancy.  COMPARISON:  None.  FINDINGS: Intrauterine gestational sac: Single normal-appearing intrauterine gestational sac.  Yolk sac:  Visualized  Embryo:  Visualized  Cardiac Activity: Visualized  Heart Rate: 131  bpm  CRL:  8.7  mm   6 w   6 d                  [place] EDC: 09/07/2015  Maternal uterus/adnexae:  Normal appearance of the anteverted uterus. No discrete uterine mass.  Normal size and appearance of the right ovary, measuring approximately 3.4 x 2.3 x 2.5 cm. No discrete right-sided ovarian or adnexal lesions.  Normal size and appearance of the left ovary measuring approximately 3.5 x 2.0 x 2.9 cm. Note is made of 2 adjacent anechoic left-sided corpus luteal cysts with dominant cyst measuring approximately 1.9 x 1.3 x 1.3 cm.  No free fluid within the pelvic cul-de-sac.  IMPRESSION: Single viable intrauterine gestation with crown rump length compatible with 6 weeks, 6 days gestation and estimated delivery date of 09/07/2015   Electronically Signed   By: Simonne Come M.D.   On: 01/18/2015 01:23      Harle Battiest,  NP 01/19/15 1347  Cy Blamer, MD 01/21/15 2316

## 2015-01-18 NOTE — ED Notes (Signed)
Pt. tolerated Ginger Ale with no nausea or vomitting . Denies abdominal pain , IV site unremarkable .

## 2015-01-18 NOTE — Discharge Instructions (Signed)
Please follow the directions provided. You will need to call the [facility] to arrange an appointment to establish prenatal care. They have a maternity emergency room that is open 24 hours, if you have any more nausea or vomiting symptoms. You may use the Phenergan as needed for nausea. You may use Tylenol as needed for pain. Be sure to drink clear fluids by mouth to stay well hydrated. Don't hesitate to return for any new, worsening, or concerning symptoms.   SEEK IMMEDIATE MEDICAL CARE IF:  You have a fever.  You are leaking fluid from your vagina.  You have spotting or bleeding from your vagina.  You have severe abdominal cramping or pain.  You have rapid weight gain or loss.  You vomit blood or material that looks like coffee grounds.  You are exposed to [place] measles and have never had them.  You are exposed to fifth disease or chickenpox.  You develop a severe headache.  You have shortness of breath.  You have any kind of trauma, such as from a fall or a car accident.    Emergency Department Resource Guide 1) Find a Doctor and Pay Out of Pocket Although you won't have to find out who is covered by your insurance plan, it is a good idea to ask around and get recommendations. You will then need to call the office and see if the doctor you have chosen will accept you as a new patient and what types of options they offer for patients who are self-pay. Some doctors offer discounts or will set up payment plans for their patients who do not have insurance, but you will need to ask so you aren't surprised when you get to your appointment.  2) Contact Your Local Health Department Not all health departments have doctors that can see patients for sick visits, but many do, so it is worth a call to see if yours does. If you don't know where your local health department is, you can check in your phone book. The CDC also has a tool to help you locate your state's health department, and many  state websites also have listings of all of their local health departments.  3) Find a Walk-in Clinic If your illness is not likely to be very severe or complicated, you may want to try a walk in clinic. These are popping up all over the country in pharmacies, drugstores, and shopping centers. They're usually staffed by nurse practitioners or physician assistants that have been trained to treat common illnesses and complaints. They're usually fairly quick and inexpensive. However, if you have serious medical issues or chronic medical problems, these are probably not your best option.  No Primary Care Doctor: - Call Health Connect at  [phone] - they can help you locate a primary care doctor that  accepts your insurance, provides certain services, etc. - Physician Referral Service- [phone]  Chronic Pain Problems: Organization         Address  Phone   Notes  [facility]  [phone] Patients need to be referred by their primary care doctor.   Medication Assistance: Organization         Address  Phone   Notes  [facility] [address] [place], [place] [phone] --Must be a resident of [facility] -- Must have NO insurance coverage whatsoever (no Medicaid/ Medicare, etc.) -- The pt. MUST have a primary care doctor that directs their  care regularly and follows them in the community   MedAssist  [phone]   [employer]  [phone]    Agencies that provide inexpensive medical care: Organization         Address  Phone   Notes  [facility]  [phone]   [facility]    [phone]   [facility] [address] [place], [place] [address] [phone]   [facility] [address] [place] [place] [phone]   Planned Parenthood    [phone]   [facility]    [phone]   Community Health and  [facility]  [address], [place] Phone:  [phone], Fax:  [phone] Hours of Operation:  9 am - 6 pm, M-F.  Also accepts Medicaid/Medicare and self-pay.  [facility]  [address], [place] Phone: [phone], Fax: [phone]. Hours of Operation:  8:30 am - 5:30 pm, M-F.  Also accepts Medicaid and self-pay.  [facility] [place] [address] [place] Phone: [phone]   [facility] [address] Natasha Bence [place], [place] [phone], Ext. [phone] Mondays & Thursdays: 7-9 AM.  First 15 patients are seen on a first come, first serve basis.    Medicaid-accepting [facility] Providers:  Organization         Address  Phone   Notes  [facility] [address], [place] [phone] Also accepts self-pay patients.  [facility] [address] Laurell Josephs [place], [place]  [phone]   [facility] [address], [place] [phone]   [facility] [address], [place] [phone]   Renaye Rakers [address], [place]   [phone] Only accepts [place] Access [place] patients after they have their name applied to their card.   Self-Pay (no insurance) in [facility]:  Organization         Address  Phone   Notes  Sickle Cell Patients, [facility] [address] [place], [place] [phone]   [facility] [address] [place], [place] [phone]   [facility]  [address],  [phone]   [facility]/Dr. Osei-Bonsu  [address], [place] or [address], [place] [phone] Phone number for both [place] and [place] locations is the same.  [facility] [address] [place] [phone]   [facility] [address] [place] or [address] [phone]   [facility] [address] [place] [phone], phone; [phone], fax Sees patients 1st and 3rd Saturday of every month.  Must not qualify for public or private insurance (i.e. Medicaid, Medicare, [place] Health Choice, Veterans' Benefits)  Household income should be no more than 200% of the poverty level The clinic cannot treat you if you are pregnant or think you are pregnant  Sexually transmitted diseases are not treated at the clinic.    Dental Care: Organization         Address  Phone  Notes  [facility] of [facility] [address] [place], [place] [phone] Accepts children up to age  21 who are enrolled in Medicaid or [place] Health Choice; pregnant women with a Medicaid card; and children who have applied for Medicaid or [place] Health Choice, but were declined, whose parents can pay a reduced fee at time of service.  [facility] Department of [facility]  [address], [place] [phone] Accepts children up to age 45 who are enrolled in [place] or [place] Health Choice; pregnant women with a Medicaid card; and children who have applied for Medicaid or [place] Health Choice, but were declined, whose parents can pay a reduced fee at time of service.  [facility]  [address] [place], [place] [phone] Patients are seen by appointment only. Walk-ins are not accepted. [facility] will see patients 80 years of age and older. Monday - Tuesday (8am-5pm) Most Wednesdays (8:30-5pm) $30 per visit, cash only  [facility] Adult Dental Access PROGRAM  [address], [facility] [phone] Patients are seen by appointment only. Walk-ins are not accepted. [facility] will see patients 59 years of age and older. One Wednesday Evening (Monthly: Volunteer Based).  $30 per visit, cash only  [employer]  [phone] for adults; Children under age 45, call Graduate Pediatric Dentistry at [phone]. Children aged 32-14, please call [phone] to request a pediatric application.  Dental services are provided in all areas of dental care including fillings, crowns and bridges, complete and partial dentures, implants, gum treatment, root canals, and extractions. Preventive care is also provided. Treatment is provided to both adults and children. Patients are selected via a lottery and there is often a waiting list.   [facility] [address] [place]  [phone] [email or web address]   [facility] [address] [place], [place] [phone], Ext. [phone] Second and Fourth Thursday of each month, opens at 6:30 AM; Clinic ends at 9 AM.  Patients are seen on a first-come first-served basis, and a limited number are seen during each clinic.   [facility]  [address] Ether Griffins [place], [place] [phone]   Eligibility Requirements You must have lived in [place], [place], or [place] counties for at least the last three months.   You cannot be eligible for state or federal sponsored [place], including [employer], [place], or [employer].   You generally cannot be eligible for healthcare insurance through your employer.    How to apply: Eligibility screenings are held every Tuesday and Wednesday afternoon from 1:00 pm until 4:00 pm. You do not need an appointment for the interview!  [facility] [address] [place], [place] [phone]   [facility] Health Department  [phone]   [facility] Health Department  [phone]   [facility] Health Department  [phone]    Behavioral Health Resources in the Community: Intensive Outpatient Programs Organization         Address  Phone  Notes  [facility] [address], [place], [place]  [phone]   [facility] Outpatient [address] [place], [place] [phone]   [facility] [address], [place], [place]  [phone]   [facility] [address]  [place], [place] [phone] or [phone]   Substance Abuse Resources Organization         Address  Phone  Notes  [facility] Drug Services  [phone]   [facility]  [phone]   [facility]  [phone]   Floydene Flock  [phone]   Residential & Outpatient Substance Abuse Program  [phone]   Psychological Services Organization         Address  Phone  Notes  [facility] Behavioral Health  [phone]   [facility]  [phone]   [facility] Health [address] [place] [phone] or [phone]    Mobile Crisis Teams Organization         Address  Phone  Notes  [facility], Mobile Crisis Care Unit  [phone]   [facility]  [address] [place], [place] [phone]   Doristine Locks [address] [place] [phone]    Self-Help/Support Groups Organization         Address  Phone             Notes  [facility] [place] - variety of support groups  336- I7437963 Call for more information  Narcotics Anonymous (NA), [facility] [address], [employer] [place]  2 meetings at this location   Statistician         Address  Phone  Notes  [facility] [address],    [place]  [phone]   [facility]  [address] [place] [address], [place], [place] [phone]   [facility] Treatment Facility [address] [place], [place] [phone] Admissions: 8am-3pm M-F  [facility] [address],    [place], [place] [phone]   [facility] [address] [place], [place], [place] [phone]   [facility] [address],    [place], [place] [phone]   [facility] - Intensive Outpatient [address] Laurell Josephs [address], [place], [place] [phone]   [facility] ([facility] [address] [place]  [place], [place] [phone] or [phone]   [facility] ([facility]) [address] [place], [place] [phone] Accepts Medicaid  [facility] [address]  [place] [phone] Substance Abuse/Addiction Treatment   [facility] Organization         Address  Phone  Notes  [facility]  [phone]   Angie Fava, PhD [address] Ervin Knack [place], [place]   [phone] or [phone]   [facility] Behavioral   [address] [place], [place] [phone]   [facility] [address], [place], [place] [phone] Insurance/Medicaid/sponsorship through [facility] [address]                                    [place], [place] [phone] Therapy/tele-psych/case  [facility] [address], [place] [phone]    Dr. Lolly Mustache  [phone]   [facility]  [employer] [facility] Dept. 1) [address] [place] 2) [address] [place] 3)  [address], [place] [phone]  [phone]   [facility] Child Abuse Hotline [phone] or [phone] (After Hours)

## 2015-01-27 ENCOUNTER — Encounter (HOSPITAL_COMMUNITY): Payer: Self-pay | Admitting: *Deleted

## 2015-01-27 ENCOUNTER — Inpatient Hospital Stay (HOSPITAL_COMMUNITY)
Admission: AD | Admit: 2015-01-27 | Discharge: 2015-01-27 | Disposition: A | Discharge status: Home / Self Care | Payer: Medicaid Other | Source: Ambulatory Visit | Attending: Obstetrics & Gynecology | Admitting: Obstetrics & Gynecology

## 2015-01-27 DIAGNOSIS — O219 Vomiting of pregnancy, unspecified: Secondary | ICD-10-CM

## 2015-01-27 DIAGNOSIS — O210 Mild hyperemesis gravidarum: Secondary | ICD-10-CM | POA: Insufficient documentation

## 2015-01-27 DIAGNOSIS — Z3A09 9 weeks gestation of pregnancy: Secondary | ICD-10-CM | POA: Insufficient documentation

## 2015-01-27 LAB — URINALYSIS, ROUTINE W REFLEX MICROSCOPIC
Bilirubin Urine: NEGATIVE
Glucose, UA: 100 mg/dL — AB
Hgb urine dipstick: NEGATIVE
Ketones, ur: NEGATIVE mg/dL
Leukocytes, UA: NEGATIVE
Nitrite: NEGATIVE
PH: 7 (ref 5.0–8.0)
PROTEIN: NEGATIVE mg/dL
SPECIFIC GRAVITY, URINE: 1.015 (ref 1.005–1.030)
Urobilinogen, UA: 1 mg/dL (ref 0.0–1.0)

## 2015-01-27 LAB — CBC
HEMATOCRIT: 33.7 % — AB (ref 36.0–46.0)
Hemoglobin: 11.9 g/dL — ABNORMAL LOW (ref 12.0–15.0)
MCH: 31.5 pg (ref 26.0–34.0)
MCHC: 35.3 g/dL (ref 30.0–36.0)
MCV: 89.2 fL (ref 78.0–100.0)
Platelets: 204 10*3/uL (ref 150–400)
RBC: 3.78 MIL/uL — AB (ref 3.87–5.11)
RDW: 12.5 % (ref 11.5–15.5)
WBC: 7.7 10*3/uL (ref 4.0–10.5)

## 2015-01-27 LAB — COMPREHENSIVE METABOLIC PANEL
ALT: 57 U/L — AB (ref 14–54)
ANION GAP: 12 (ref 5–15)
AST: 52 U/L — AB (ref 15–41)
Albumin: 4.2 g/dL (ref 3.5–5.0)
Alkaline Phosphatase: 24 U/L — ABNORMAL LOW (ref 38–126)
BUN: <5 mg/dL — ABNORMAL LOW (ref 6–20)
CALCIUM: 10 mg/dL (ref 8.9–10.3)
CO2: 25 mmol/L (ref 22–32)
Chloride: 101 mmol/L (ref 101–111)
Creatinine, Ser: 0.69 mg/dL (ref 0.44–1.00)
GFR calc Af Amer: >60 mL/min (ref >60)
GFR calc non Af Amer: >60 mL/min (ref >60)
Glucose, Bld: 98 mg/dL (ref 65–99)
Potassium: 3.6 mmol/L (ref 3.5–5.1)
Sodium: 138 mmol/L (ref 135–145)
Total Bilirubin: 0.4 mg/dL (ref 0.3–1.2)
Total Protein: 7.1 g/dL (ref 6.5–8.1)

## 2015-01-27 LAB — HCG, QUANTITATIVE, PREGNANCY: HCG, BETA CHAIN, QUANT, S: 135244 m[IU]/mL — AB (ref <5)

## 2015-01-27 LAB — POCT PREGNANCY, URINE: Preg Test, Ur: POSITIVE — AB

## 2015-01-27 MED ORDER — ONDANSETRON HCL 4 MG PO TABS: 4.0000 mg | ORAL_TABLET | Freq: Four times a day (QID) | ORAL | Status: DC

## 2015-01-27 MED ORDER — ACETAMINOPHEN 325 MG PO TABS
650.0000 mg | ORAL_TABLET | Freq: Once | ORAL | Status: AC
  Administered 2015-01-27: 650 mg via ORAL
  Filled 2015-01-27 (×2): qty 2

## 2015-01-27 MED ORDER — ONDANSETRON 8 MG PO TBDP
8.0000 mg | ORAL_TABLET | Freq: Once | ORAL | Status: AC
  Administered 2015-01-27: 8 mg via ORAL
  Filled 2015-01-27: qty 1

## 2015-01-27 MED ORDER — LACTATED RINGERS IV BOLUS (SEPSIS)
1000.0000 mL | Freq: Once | INTRAVENOUS | Status: AC
  Administered 2015-01-27: 1000 mL via INTRAVENOUS

## 2015-01-27 NOTE — MAU Provider Note (Signed)
History     CSN: [id number]  Arrival date and time: 01/27/15 1232   None     Chief Complaint  Patient presents with  . Emesis During Pregnancy  . Dizziness   HPI  Pt is [redacted]w[redacted]d pregnant and presents with nausea and vomiting in pregnancy.  Pt was previously seen In [place] 01/17/2015 with hyperemesis and had confirmation of pregnancy and labs.pt was given a prescription for phenergan suppositories which did not work.  Pt took a friend's zofran and it worked; pt also says IVF helps and she wants IVF Pt wants Zofran b/c she says it is the only thing that works for her-risks discussed with pt Pt denies spotting or bleeding or UTI symptoms  Job Founds Spurlock-Frizzell, RN Registered Nurse Signed  MAU Note 01/27/2015 1:21 PM    Expand All Collapse All   Been throwing up, so bad - feeling dizzy. Can't keep anything down.      No past medical history on file.  No past surgical history on file.  No family history on file.  History  Substance Use Topics  . Smoking status: Never Smoker   . Smokeless tobacco: Not on file  . Alcohol Use: Yes    Allergies:  Allergies  Allergen Reactions  . Aleve [Naproxen Sodium] Hives    Prescriptions prior to admission  Medication Sig Dispense Refill Last Dose  . cyclobenzaprine (FLEXERIL) 10 MG tablet Take 1 tablet (10 mg total) by mouth 2 (two) times daily as needed for muscle spasms. (Patient not taking: Reported on 01/18/2015) 20 tablet 0 Not Taking at Unknown time  . promethazine (PHENERGAN) 25 MG suppository Place 1 suppository (25 mg total) rectally every 6 (six) hours as needed for nausea or vomiting. 4 each 0   . traMADol (ULTRAM) 50 MG tablet Take 1 tablet (50 mg total) by mouth every 6 (six) hours as needed. (Patient not taking: Reported on 01/18/2015) 15 tablet 0 Not Taking at Unknown time    Review of Systems  Constitutional: Negative for fever and chills.  Gastrointestinal: Positive for vomiting and abdominal pain. Negative for  diarrhea and constipation.  Genitourinary: Negative for dysuria and urgency.   Physical Exam   Blood pressure 103/62, pulse 86, temperature 98.2 F (36.8 C), temperature source Oral, resp. rate 16, height  (1.676 m), weight 125 lb (56.7 kg), last menstrual period 11/24/2014.  Physical Exam  Nursing note and vitals reviewed. Constitutional: She is oriented to person, place, and time. She appears well-developed and well-nourished.  HENT:  Head: Normocephalic.  Eyes: Pupils are equal, round, and reactive to light.  Neck: Normal range of motion.  Cardiovascular: Normal rate.   Respiratory: Effort normal.  GI: Soft. She exhibits no distension. There is no tenderness. There is no rebound and no guarding.  Musculoskeletal: Normal range of motion.  Neurological: She is alert and oriented to person, place, and time.  Skin: Skin is warm and dry.  Psychiatric: She has a normal mood and affect.    MAU Course  Procedures Results for orders placed or performed during the hospital encounter of 01/27/15 (from the past 24 hour(s))  Urinalysis, Routine w reflex microscopic (not at [facility])     Status: Abnormal   Collection Time: 01/27/15 12:49 PM  Result Value Ref Range   Color, Urine YELLOW YELLOW   APPearance CLEAR CLEAR   Specific Gravity, Urine 1.015 1.005 - 1.030   pH 7.0 5.0 - 8.0   Glucose, UA 100 (A) NEGATIVE mg/dL  Hgb urine dipstick NEGATIVE NEGATIVE   Bilirubin Urine NEGATIVE NEGATIVE   Ketones, ur NEGATIVE NEGATIVE mg/dL   Protein, ur NEGATIVE NEGATIVE mg/dL   Urobilinogen, UA 1.0 0.0 - 1.0 mg/dL   Nitrite NEGATIVE NEGATIVE   Leukocytes, UA NEGATIVE NEGATIVE  Pregnancy, urine POC     Status: Abnormal   Collection Time: 01/27/15 12:58 PM  Result Value Ref Range   Preg Test, Ur POSITIVE (A) NEGATIVE  CBC     Status: Abnormal   Collection Time: 01/27/15  2:14 PM  Result Value Ref Range   WBC 7.7 4.0 - 10.5 K/uL   RBC 3.78 (L) 3.87 - 5.11 MIL/uL   Hemoglobin 11.9 (L) 12.0  - 15.0 g/dL   HCT 19.133.7 (L) 47.836.0 - 29.546.0 %   MCV 89.2 78.0 - 100.0 fL   MCH 31.5 26.0 - 34.0 pg   MCHC 35.3 30.0 - 36.0 g/dL   RDW 62.112.5 30.811.5 - 65.715.5 %   Platelets 204 150 - 400 K/uL  hCG, quantitative, pregnancy     Status: Abnormal   Collection Time: 01/27/15  2:14 PM  Result Value Ref Range   hCG, Beta Chain, Quant, S 135244 (H) <5 mIU/mL  Comprehensive metabolic panel     Status: Abnormal   Collection Time: 01/27/15  2:14 PM  Result Value Ref Range   Sodium 138 135 - 145 mmol/L   Potassium 3.6 3.5 - 5.1 mmol/L   Chloride 101 101 - 111 mmol/L   CO2 25 22 - 32 mmol/L   Glucose, Bld 98 65 - 99 mg/dL   BUN <5 (L) 6 - 20 mg/dL   Creatinine, Ser 8.460.69 0.44 - 1.00 mg/dL   Calcium 96.210.0 8.9 - 95.210.3 mg/dL   Total Protein 7.1 6.5 - 8.1 g/dL   Albumin 4.2 3.5 - 5.0 g/dL   AST 52 (H) 15 - 41 U/L   ALT 57 (H) 14 - 54 U/L   Alkaline Phosphatase 24 (L) 38 - 126 U/L   Total Bilirubin 0.4 0.3 - 1.2 mg/dL   GFR calc non Af Amer >60 >60 mL/min   GFR calc Af Amer >60 >60 mL/min   Anion gap 12 5 - 15  IVF 1 liter given and zofran 8mg  ODT Pt felt much better and ready to go home  Assessment and Plan  Nausea and vomiting in pregnancy SLIUP 7063w1d pregnant Rx Zofran 4mg  Tablets Proceed with OB care  Leshia Kope 01/27/2015, 2:02 PM

## 2015-01-27 NOTE — Discharge Instructions (Signed)
°  ________________________________________ ° ° ° ° °To schedule your Maternity Eligibility Appointment, please call [phone].  When you arrive for your appointment you must bring the following items or information listed below.  Your appointment will be rescheduled if you do not have these items or are 15 minutes late. °If currently receiving Medicaid, you MUST bring: °1. Medicaid Card °2. Social Security Card °3. Picture ID °4. Proof of Pregnancy °5. Verification of current address if the address on Medicaid card is incorrect "postmarked mail" °If not receiving Medicaid, you MUST bring: °1. Social Security Card °2. Picture ID °3. Birth Certificate (if available) Passport or *Green Card °4. Proof of Pregnancy °5. Verification of current address "postmarked mail" for each income presented. °6. Verification of insurance coverage, if any °7. Check stubs from each employer for the previous month (if unable to present check stub  °for each week, we will accept check stub for the first and last week ill the same month.) If you can't locate check stubs, you must bring a letter from the employer(s) and it must have the following information on letterhead, typed, in English: °o name of company °o company telephone number °o how long been with the company, if less than one month °o how much person earns per hour °o how many hours per week work °o the gross pay the person earned for the previous month °If you are 31 years old or less, you do not have to bring proof of income unless you work or live with the father of the baby and at that time we will need proof of income from you and/or the father of the baby. °Green Card recipients are eligible for Medicaid for Pregnant Women (MPW) ° °Safe Medications in Pregnancy  ° °Acne: °Benzoyl Peroxide °Salicylic Acid ° °Backache/Headache: °Tylenol: 2 regular strength every 4 hours OR °             2 Extra strength every 6 hours ° °Colds/Coughs/Allergies: °Benadryl (alcohol free)  25 mg every 6 hours as needed °Breath right strips °Claritin °Cepacol throat lozenges °Chloraseptic throat spray °Cold-Eeze- up to three times per day °Cough drops, alcohol free °Flonase (by prescription only) °Guaifenesin °Mucinex °Robitussin DM (plain only, alcohol free) °Saline nasal spray/drops °Sudafed (pseudoephedrine) & Actifed ** use only after [redacted] weeks gestation and if you do not have high blood pressure °Tylenol °Vicks Vaporub °Zinc lozenges °Zyrtec  ° °Constipation: °Colace °Ducolax suppositories °Fleet enema °Glycerin suppositories °Metamucil °Milk of magnesia °Miralax °Senokot °Smooth move tea ° °Diarrhea: °Kaopectate °Imodium A-D ° °*NO pepto Bismol ° °Hemorrhoids: °Anusol °Anusol HC °Preparation H °Tucks ° °Indigestion: °Tums °Maalox °Mylanta °Zantac  °Pepcid ° °Insomnia: °Benadryl (alcohol free) 25mg every 6 hours as needed °Tylenol PM °Unisom, no Gelcaps ° °Leg Cramps: °Tums °MagGel ° °Nausea/Vomiting:  °Bonine °Dramamine °Emetrol °Ginger extract °Sea bands °Meclizine  °Nausea medication to take during pregnancy:  °Unisom (doxylamine succinate 25 mg tablets) Take one tablet daily at bedtime. If symptoms are not adequately controlled, the dose can be increased to a maximum recommended dose of two tablets daily (1/2 tablet in the morning, 1/2 tablet mid-afternoon and one at bedtime). °Vitamin B6 100mg tablets. Take one tablet twice a day (up to 200 mg per day). ° °Skin Rashes: °Aveeno products °Benadryl cream or 25mg every 6 hours as needed °Calamine Lotion °1% cortisone cream ° °Yeast infection: °Gyne-lotrimin 7 °Monistat 7 ° ° °**If taking multiple medications, please check labels to avoid duplicating the same active ingredients °**take medication as   directed on the label ** Do not exceed 4000 mg of tylenol in 24 hours **Do not take medications that contain aspirin or ibuprofen   Morning Sickness Morning sickness is when you feel sick to your stomach (nauseous) during pregnancy. This nauseous  feeling may or may not come with vomiting. It often occurs in the morning but can be a problem any time of day. Morning sickness is most common during the first trimester, but it may continue throughout pregnancy. While morning sickness is unpleasant, it is usually harmless unless you develop severe and continual vomiting (hyperemesis gravidarum). This condition requires more intense treatment.  CAUSES  The cause of morning sickness is not completely known but seems to be related to normal hormonal changes that occur in pregnancy. RISK FACTORS You are at greater risk if you:  Experienced nausea or vomiting before your pregnancy.  Had morning sickness during a previous pregnancy.  Are pregnant with more than one baby, such as twins. TREATMENT  Do not use any medicines (prescription, over-the-counter, or herbal) for morning sickness without first talking to your health care provider. Your health care provider may prescribe or recommend:  Vitamin B6 supplements.  Anti-nausea medicines.  The herbal medicine ginger. HOME CARE INSTRUCTIONS   Only take over-the-counter or prescription medicines as directed by your health care provider.  Taking multivitamins before getting pregnant can prevent or decrease the severity of morning sickness in most women.  Eat a piece of dry toast or unsalted crackers before getting out of bed in the morning.  Eat five or six small meals a day.  Eat dry and bland foods (rice, baked potato). Foods high in carbohydrates are often helpful.  Do not drink liquids with your meals. Drink liquids between meals.  Avoid greasy, fatty, and spicy foods.  Get someone to cook for you if the smell of any food causes nausea and vomiting.  If you feel nauseous after taking prenatal vitamins, take the vitamins at night or with a snack.  Snack on protein foods (nuts, yogurt, cheese) between meals if you are hungry.  Eat unsweetened gelatins for desserts.  Wearing an  acupressure wristband (worn for sea sickness) may be helpful.  Acupuncture may be helpful.  Do not smoke.  Get a humidifier to keep the air in your house free of odors.  Get plenty of fresh air. SEEK MEDICAL CARE IF:   Your home remedies are not working, and you need medicine.  You feel dizzy or lightheaded.  You are losing weight. SEEK IMMEDIATE MEDICAL CARE IF:   You have persistent and uncontrolled nausea and vomiting.  You pass out (faint). MAKE SURE YOU:  Understand these instructions.  Will watch your condition.  Will get help right away if you are not doing well or get worse. Document Released: 10/04/2006 Document Revised: 08/18/2013 Document Reviewed: 01/28/2013 [facility] Patient Information 2015 [place]. This information is not intended to replace advice given to you by your health care provider. Make sure you discuss any questions you have with your health care provider.

## 2015-01-27 NOTE — MAU Note (Signed)
Been throwing up, so bad - feeling dizzy. Can't keep anything down.

## 2015-08-28 NOTE — L&D Delivery Note (Signed)
30 y/o G4 now P4 induced for post dates. No other complications of pregnancy. Pt given 1 cytotec  Delivery Note At 3:10 PM a viable female was delivered via Vaginal, Spontaneous Delivery (Presentation: ROA  ).  APGAR:  9, 9; weight 7 lb 0.4 oz (3185 g).   Placenta status: delviered intact with 3 vessel Cord  Anesthesia:  epidural Episiotomy: None Lacerations: 1st degree Suture Repair: 3.0 Est. Blood Loss (mL): 150  Mom to postpartum.  Baby to Couplet care / Skin to Skin.  Misty Hall 04/17/2016, 5:38 PM

## 2015-10-20 ENCOUNTER — Encounter: Payer: Self-pay | Admitting: General Practice

## 2015-10-20 ENCOUNTER — Ambulatory Visit (INDEPENDENT_AMBULATORY_CARE_PROVIDER_SITE_OTHER): Payer: Medicaid Other | Admitting: General Practice

## 2015-10-20 DIAGNOSIS — Z3201 Encounter for pregnancy test, result positive: Secondary | ICD-10-CM | POA: Diagnosis not present

## 2015-10-20 DIAGNOSIS — O2341 Unspecified infection of urinary tract in pregnancy, first trimester: Secondary | ICD-10-CM

## 2015-10-20 DIAGNOSIS — Z3492 Encounter for supervision of normal pregnancy, unspecified, second trimester: Secondary | ICD-10-CM

## 2015-10-20 LAB — POCT PREGNANCY, URINE: Preg Test, Ur: POSITIVE — AB

## 2015-10-20 NOTE — Progress Notes (Signed)
Patient here for pregnancy test today. Upt +. Reports first positive home test beginning of December. LMP 07/06/15 EDD 04/11/16 [redacted]w[redacted]d. New OB packet given. Patient requests to start care here. Will do labs today & return for new OB appt. Anatomy u/s scheduled for 3/23 @ 2pm. Patient had no questions. Encouraged pnv. FHR 160.

## 2015-10-21 LAB — PRENATAL PROFILE (SOLSTAS)
Antibody Screen: NEGATIVE
BASOS ABS: 0 10*3/uL (ref 0.0–0.1)
Basophils Relative: 0 % (ref 0–1)
Eosinophils Absolute: 0.1 10*3/uL (ref 0.0–0.7)
Eosinophils Relative: 2 % (ref 0–5)
HCT: 31.2 % — ABNORMAL LOW (ref 36.0–46.0)
HIV 1&2 Ab, 4th Generation: NONREACTIVE
Hemoglobin: 10.6 g/dL — ABNORMAL LOW (ref 12.0–15.0)
Hepatitis B Surface Ag: NEGATIVE
LYMPHS ABS: 1 10*3/uL (ref 0.7–4.0)
LYMPHS PCT: 18 % (ref 12–46)
MCH: 30.8 pg (ref 26.0–34.0)
MCHC: 34 g/dL (ref 30.0–36.0)
MCV: 90.7 fL (ref 78.0–100.0)
MONO ABS: 1.2 10*3/uL — AB (ref 0.1–1.0)
MPV: 10.5 fL (ref 8.6–12.4)
Monocytes Relative: 21 % — ABNORMAL HIGH (ref 3–12)
Neutro Abs: 3.2 10*3/uL (ref 1.7–7.7)
Neutrophils Relative %: 59 % (ref 43–77)
Platelets: 200 10*3/uL (ref 150–400)
RBC: 3.44 MIL/uL — ABNORMAL LOW (ref 3.87–5.11)
RDW: 12.9 % (ref 11.5–15.5)
RH TYPE: POSITIVE
Rubella: 1.57 Index — ABNORMAL HIGH (ref <0.90)
WBC: 5.5 10*3/uL (ref 4.0–10.5)

## 2015-10-22 LAB — PRESCRIPTION MONITORING PROFILE (19 PANEL)
AMPHETAMINE/METH: NEGATIVE ng/mL
BENZODIAZEPINE SCREEN, URINE: NEGATIVE ng/mL
BUPRENORPHINE, URINE: NEGATIVE ng/mL
Barbiturate Screen, Urine: NEGATIVE ng/mL
CREATININE, URINE: 104.59 mg/dL (ref >20.0)
Cannabinoid Scrn, Ur: NEGATIVE ng/mL
Carisoprodol, Urine: NEGATIVE ng/mL
Cocaine Metabolites: NEGATIVE ng/mL
FENTANYL URINE: NEGATIVE ng/mL
MDMA URINE: NEGATIVE ng/mL
MEPERIDINE UR: NEGATIVE ng/mL
Methadone Screen, Urine: NEGATIVE ng/mL
Methaqualone: NEGATIVE ng/mL
NITRITES URINE, INITIAL: NEGATIVE ug/mL
OXYCODONE SCRN UR: NEGATIVE ng/mL
Opiate Screen, Urine: NEGATIVE ng/mL
PHENCYCLIDINE, UR: NEGATIVE ng/mL
Propoxyphene: NEGATIVE ng/mL
TAPENTADOLUR: NEGATIVE ng/mL
TRAMADOL UR: NEGATIVE ng/mL
Zolpidem, Urine: NEGATIVE ng/mL
pH, Initial: 6.7 pH (ref 4.5–8.9)

## 2015-10-23 LAB — CULTURE, OB URINE: Colony Count: >=100000

## 2015-10-24 ENCOUNTER — Telehealth: Payer: Self-pay | Admitting: *Deleted

## 2015-10-24 ENCOUNTER — Encounter: Payer: Self-pay | Admitting: Obstetrics & Gynecology

## 2015-10-24 DIAGNOSIS — O2341 Unspecified infection of urinary tract in pregnancy, first trimester: Secondary | ICD-10-CM | POA: Insufficient documentation

## 2015-10-24 LAB — HEMOGLOBINOPATHY EVALUATION
HGB F QUANT: 0 % (ref 0.0–2.0)
Hemoglobin Other: 0 %
Hgb A2 Quant: 3.5 % — ABNORMAL HIGH (ref 2.2–3.2)
Hgb A: 55.3 % — ABNORMAL LOW (ref 96.8–97.8)
Hgb S Quant: 41.2 % — ABNORMAL HIGH

## 2015-10-24 MED ORDER — AMOXICILLIN 500 MG PO CAPS: 500.0000 mg | ORAL_CAPSULE | Freq: Three times a day (TID) | ORAL | Status: DC

## 2015-10-24 NOTE — Addendum Note (Signed)
Addended by: Jaynie Collins A on: 10/24/2015 06:56 AM   Modules accepted: Orders

## 2015-10-24 NOTE — Telephone Encounter (Signed)
Called patient per Illene Bolus. Patient has a UTI, abx sent to her pharmacy. Called patient and informed her. Understanding voiced, no further questions.

## 2015-10-25 ENCOUNTER — Encounter: Payer: Self-pay | Admitting: Obstetrics & Gynecology

## 2015-10-25 DIAGNOSIS — D573 Sickle-cell trait: Secondary | ICD-10-CM | POA: Insufficient documentation

## 2015-11-15 ENCOUNTER — Encounter: Payer: Self-pay | Admitting: Certified Nurse Midwife

## 2015-11-17 ENCOUNTER — Ambulatory Visit (HOSPITAL_COMMUNITY)
Admission: RE | Admit: 2015-11-17 | Discharge: 2015-11-17 | Disposition: A | Discharge status: Home / Self Care | Payer: Medicaid Other | Source: Ambulatory Visit | Attending: Obstetrics & Gynecology | Admitting: Obstetrics & Gynecology

## 2015-11-17 ENCOUNTER — Other Ambulatory Visit: Payer: Self-pay | Admitting: General Practice

## 2015-11-17 DIAGNOSIS — Z3689 Encounter for other specified antenatal screening: Secondary | ICD-10-CM

## 2015-11-17 DIAGNOSIS — Z3A19 19 weeks gestation of pregnancy: Secondary | ICD-10-CM

## 2015-11-17 DIAGNOSIS — Z36 Encounter for antenatal screening of mother: Secondary | ICD-10-CM | POA: Diagnosis present

## 2015-11-17 DIAGNOSIS — Z3492 Encounter for supervision of normal pregnancy, unspecified, second trimester: Secondary | ICD-10-CM

## 2016-01-02 ENCOUNTER — Ambulatory Visit (INDEPENDENT_AMBULATORY_CARE_PROVIDER_SITE_OTHER): Payer: Medicaid Other | Admitting: Medical

## 2016-01-02 ENCOUNTER — Encounter: Payer: Self-pay | Admitting: Medical

## 2016-01-02 VITALS — BP 117/65 | HR 84 | Temp 98.8°F | Ht 68.0 in | Wt 145.3 lb

## 2016-01-02 DIAGNOSIS — Z113 Encounter for screening for infections with a predominantly sexual mode of transmission: Secondary | ICD-10-CM

## 2016-01-02 DIAGNOSIS — Z124 Encounter for screening for malignant neoplasm of cervix: Secondary | ICD-10-CM

## 2016-01-02 DIAGNOSIS — Z1151 Encounter for screening for human papillomavirus (HPV): Secondary | ICD-10-CM | POA: Diagnosis not present

## 2016-01-02 DIAGNOSIS — Z3492 Encounter for supervision of normal pregnancy, unspecified, second trimester: Secondary | ICD-10-CM

## 2016-01-02 DIAGNOSIS — Z3490 Encounter for supervision of normal pregnancy, unspecified, unspecified trimester: Secondary | ICD-10-CM | POA: Insufficient documentation

## 2016-01-02 LAB — POCT URINALYSIS DIP (DEVICE)
BILIRUBIN URINE: NEGATIVE
Glucose, UA: NEGATIVE mg/dL
Hgb urine dipstick: NEGATIVE
KETONES UR: NEGATIVE mg/dL
NITRITE: NEGATIVE
PH: 6 (ref 5.0–8.0)
Protein, ur: NEGATIVE mg/dL
Specific Gravity, Urine: 1.015 (ref 1.005–1.030)
Urobilinogen, UA: 2 mg/dL — ABNORMAL HIGH (ref 0.0–1.0)

## 2016-01-02 NOTE — Progress Notes (Signed)
Subjective:  Misty Hall is a 30 y.o. [phone] at 964w5d being seen today for ongoing prenatal care.  She is currently monitored for the following issues for this low-risk pregnancy and has UTI (urinary tract infection) in pregnancy in first trimester; Hemoglobin A-S genotype (HCC); and Supervision of low-risk pregnancy on her problem list.  Patient reports heartburn and LUQ abdominal pain.  Contractions: Irregular. Vag. Bleeding: None.  Movement: Present. Denies leaking of fluid.   The following portions of the patient's history were reviewed and updated as appropriate: allergies, current medications, past family history, past medical history, past social history, past surgical history and problem list. Problem list updated.  Objective:   Filed Vitals:   01/02/16 1322 01/02/16 1324  BP: 117/65   Pulse: 84   Temp: 98.8 F (37.1 C)   Height:  5\' 8"  (1.727 m)  Weight: 145 lb 4.8 oz (65.908 kg)     Fetal Status: Fetal Heart Rate (bpm): 152 Fundal Height: 25 cm Movement: Present     General:  Alert, oriented and cooperative. Patient is in no acute distress.  Skin: Skin is warm and dry. No rash noted.   Cardiovascular: Normal heart rate noted  Respiratory: Normal respiratory effort, no problems with respiration noted  Abdomen: Soft, gravid, appropriate for gestational age. Pain/Pressure: Present     Pelvic: Vag. Bleeding: None    Normal discharge noted. Normal vaginal mucosa. Normal cervical contour without lesions. Pap smear obtained.  Cervical exam performed Dilation: Closed Effacement (%): 0   closed, thick, posterior  Extremities: Normal range of motion.  Edema: None  Mental Status: Normal mood and affect. Normal behavior. Normal judgment and thought content.   Urinalysis: Urine Protein: Negative Urine Glucose: Negative  Assessment and Plan:  Pregnancy: [id number] at 3964w5d  1. Supervision of low-risk pregnancy, second trimester - Cytology - PAP - GC[facility]Chlamydia [facility]  2. LUQ abdominal pain  - offered Rx for PPI to trial, patient declines  - Advised to try Tylenol and ice to the area PRN for pain    Preterm labor symptoms and general obstetric precautions including but not limited to vaginal bleeding, contractions, leaking of fluid and fetal movement were reviewed in detail with the patient. Please refer to After Visit Summary for other counseling recommendations.  Return in about 3 weeks (around 01/23/2016) for LOB. 1 hour GTT, 28 week labs and Tdap at that time   Marny LowensteinJulie N Kristyna Bradstreet, PA-C

## 2016-01-02 NOTE — Progress Notes (Signed)
Here for first prenatal visit. Given new patient booklets. Denies flu shot. Moderate leukocytes noted in urinalysis.

## 2016-01-02 NOTE — Patient Instructions (Signed)
Costochondritis Costochondritis is a condition in which the tissue (cartilage) that connects your ribs with your breastbone (sternum) becomes irritated. It causes pain in the chest and rib area. It usually goes away on its own over time. HOME CARE  Avoid activities that wear you out.  Do not strain your ribs. Avoid activities that use your:  Chest.  Belly.  Side muscles.  Put ice on the area for the first 2 days after the pain starts.  Put ice in a plastic bag.  Place a towel between your skin and the bag.  Leave the ice on for 20 minutes, 2-3 times a day.  Only take medicine as told by your doctor. GET HELP IF:  You have redness or puffiness (swelling) in the rib area.  Your pain does not go away with rest or medicine. GET HELP RIGHT AWAY IF:   Your pain gets worse.  You are very uncomfortable.  You have trouble breathing.  You cough up blood.  You start sweating or throwing up (vomiting).  You have a fever or lasting symptoms for more than 2-3 days.  You have a fever and your symptoms suddenly get worse. MAKE SURE YOU:   Understand these instructions.  Will watch your condition.  Will get help right away if you are not doing well or get worse.   This information is not intended to replace advice given to you by your health care provider. Make sure you discuss any questions you have with your health care provider.   Document Released: 01/30/2008 Document Revised: 04/15/2013 Document Reviewed: 03/17/2013 Elsevier Interactive Patient Education 2016 [employer] of the uterus can occur throughout pregnancy. Contractions are not always a sign that you are in labor.  WHAT ARE BRAXTON HICKS CONTRACTIONS?  Contractions that occur before labor are called Braxton Hicks contractions, or false labor. Toward the end of pregnancy (32-34 weeks), these contractions can develop more often and may become more forceful. This is not  true labor because these contractions do not result in opening (dilatation) and thinning of the cervix. They are sometimes difficult to tell apart from true labor because these contractions can be forceful and people have different pain tolerances. You should not feel embarrassed if you go to the hospital with false labor. Sometimes, the only way to tell if you are in true labor is for your health care provider to look for changes in the cervix. If there are no prenatal problems or other health problems associated with the pregnancy, it is completely safe to be sent home with false labor and await the onset of true labor. HOW CAN YOU TELL THE DIFFERENCE BETWEEN TRUE AND FALSE LABOR? False Labor  The contractions of false labor are usually shorter and not as hard as those of true labor.   The contractions are usually irregular.   The contractions are often felt in the front of the lower abdomen and in the groin.   The contractions may go away when you walk around or change positions while lying down.   The contractions get weaker and are shorter lasting as time goes on.   The contractions do not usually become progressively stronger, regular, and closer together as with true labor.  True Labor  Contractions in true labor last 30-70 seconds, become very regular, usually become more intense, and increase in frequency.   The contractions do not go away with walking.   The discomfort is usually felt in the top of the  uterus and spreads to the lower abdomen and low back.   True labor can be determined by your health care provider with an exam. This will show that the cervix is dilating and getting thinner.  WHAT TO REMEMBER  Keep up with your usual exercises and follow other instructions given by your health care provider.   Take medicines as directed by your health care provider.   Keep your regular prenatal appointments.   Eat and drink lightly if you think you are going  into labor.   If Braxton Hicks contractions are making you uncomfortable:   Change your position from lying down or resting to walking, or from walking to resting.   Sit and rest in a tub of warm water.   Drink 2-3 glasses of water. Dehydration may cause these contractions.   Do slow and deep breathing several times an hour.  WHEN SHOULD I SEEK IMMEDIATE MEDICAL CARE? Seek immediate medical care if:  Your contractions become stronger, more regular, and closer together.   You have fluid leaking or gushing from your vagina.   You have a fever.   You pass blood-tinged mucus.   You have vaginal bleeding.   You have continuous abdominal pain.   You have low back pain that you never had before.   You feel your baby's head pushing down and causing pelvic pressure.   Your baby is not moving as much as it used to.    This information is not intended to replace advice given to you by your health care provider. Make sure you discuss any questions you have with your health care provider.   Document Released: 08/13/2005 Document Revised: 08/18/2013 Document Reviewed: 05/25/2013 Elsevier Interactive Patient Education [employer]2016 Elsevier Inc[employer]

## 2016-01-03 LAB — GC/CHLAMYDIA PROBE AMP (~~LOC~~) NOT AT ARMC
CHLAMYDIA, DNA PROBE: NEGATIVE
Neisseria Gonorrhea: NEGATIVE

## 2016-01-03 LAB — CYTOLOGY - PAP

## 2016-01-16 ENCOUNTER — Telehealth: Payer: Self-pay | Admitting: General Practice

## 2016-01-16 NOTE — Telephone Encounter (Signed)
Called patient regarding pap results & need for pap in one year. Explained HPV to patient. Patient verbalized understanding to all & had no questions

## 2016-01-24 ENCOUNTER — Ambulatory Visit (INDEPENDENT_AMBULATORY_CARE_PROVIDER_SITE_OTHER): Payer: Medicaid Other | Admitting: Family

## 2016-01-24 VITALS — BP 113/60 | HR 88 | Wt 146.0 lb

## 2016-01-24 DIAGNOSIS — Z3493 Encounter for supervision of normal pregnancy, unspecified, third trimester: Secondary | ICD-10-CM | POA: Diagnosis not present

## 2016-01-24 DIAGNOSIS — O2341 Unspecified infection of urinary tract in pregnancy, first trimester: Secondary | ICD-10-CM

## 2016-01-24 DIAGNOSIS — Z23 Encounter for immunization: Secondary | ICD-10-CM

## 2016-01-24 LAB — CBC
HEMATOCRIT: 29.1 % — AB (ref 35.0–45.0)
Hemoglobin: 10 g/dL — ABNORMAL LOW (ref 11.7–15.5)
MCH: 31.1 pg (ref 27.0–33.0)
MCHC: 34.4 g/dL (ref 32.0–36.0)
MCV: 90.4 fL (ref 80.0–100.0)
MPV: 10.4 fL (ref 7.5–12.5)
PLATELETS: 215 10*3/uL (ref 140–400)
RBC: 3.22 MIL/uL — AB (ref 3.80–5.10)
RDW: 13 % (ref 11.0–15.0)
WBC: 6.6 10*3/uL (ref 3.8–10.8)

## 2016-01-24 LAB — POCT URINALYSIS DIP (DEVICE)
BILIRUBIN URINE: NEGATIVE
GLUCOSE, UA: NEGATIVE mg/dL
Hgb urine dipstick: NEGATIVE
Ketones, ur: NEGATIVE mg/dL
Nitrite: NEGATIVE
PROTEIN: NEGATIVE mg/dL
SPECIFIC GRAVITY, URINE: 1.015 (ref 1.005–1.030)
Urobilinogen, UA: 2 mg/dL — ABNORMAL HIGH (ref 0.0–1.0)
pH: 6.5 (ref 5.0–8.0)

## 2016-01-24 NOTE — Progress Notes (Signed)
Subjective:  Misty AnesSamantha Kruckenberg is a 30 y.o. [id number] at 7164w6d being seen today for ongoing prenatal care.  She is currently monitored for the following issues for this low-risk pregnancy and has UTI (urinary tract infection) in pregnancy in first trimester; Hemoglobin A-S genotype (HCC); and Supervision of low-risk pregnancy on her problem list.  Patient reports no complaints.  Contractions: Irritability. Vag. Bleeding: None.  Movement: Present. Denies leaking of fluid.   The following portions of the patient's history were reviewed and updated as appropriate: allergies, current medications, past family history, past medical history, past social history, past surgical history and problem list. Problem list updated.  Objective:   Filed Vitals:   01/24/16 1501  BP: 113/60  Pulse: 88  Weight: 146 lb (66.225 kg)    Fetal Status: Fetal Heart Rate (bpm): 136 Fundal Height: 29 cm Movement: Present     General:  Alert, oriented and cooperative. Patient is in no acute distress.  Skin: Skin is warm and dry. No rash noted.   Cardiovascular: Normal heart rate noted  Respiratory: Normal respiratory effort, no problems with respiration noted  Abdomen: Soft, gravid, appropriate for gestational age. Pain/Pressure: Present     Pelvic: Vag. Bleeding: None     Cervical exam deferred        Extremities: Normal range of motion.  Edema: None  Mental Status: Normal mood and affect. Normal behavior. Normal judgment and thought content.   Urinalysis: Urine Protein: Negative Urine Glucose: Negative  Assessment and Plan:  Pregnancy: [id number] at 6464w6d  1. Supervision of low-risk pregnancy, third trimester - CBC - RPR - HIV antibody (with reflex) - Glucose Tolerance, 1 HR (50g) w/o Fasting - Tdap vaccine greater than or equal to 7yo IM  2. UTI (urinary tract infection) in pregnancy in first trimester - Urine culture TOC  Preterm labor symptoms and general obstetric precautions including but not limited to  vaginal bleeding, contractions, leaking of fluid and fetal movement were reviewed in detail with the patient. Please refer to After Visit Summary for other counseling recommendations.  Return in about 2 weeks (around 02/07/2016).   Eino FarberWalidah Kennith GainN Karim, CNM

## 2016-01-24 NOTE — Progress Notes (Signed)
Tip of week discussed with patient  

## 2016-01-24 NOTE — Patient Instructions (Signed)
AREA PEDIATRIC/FAMILY PRACTICE PHYSICIANS  [facility] [address] [place], [place] [address] Phone - [phone]   Fax - [phone]  [facility] [address] [place], [place]  [address] Phone - [phone]   Fax - [phone]  [facility] [address] [place], [place]  [address] Phone - [phone]   Fax - [phone]  [facility] [address] [place], [place]  [address] Phone - [phone]   Fax - [phone]  [facility] [address], [address] [place], [place]  [address] Phone - [phone]   Fax - [phone]  [facility] [address], [address] [place], [place]  [address] Phone - [phone]   Fax - [phone]  [facility] [address] [place], [place]  [address] Phone - [phone]   Fax - [phone]  [facility] [address], [address] [place], [place]  [address] Phone - [phone]   Fax - [phone]  [facility] [address] [place], [place]  [address] Phone - [phone]   Fax - [phone] [facility] [address] [place], [place]  [address] Phone - [phone]   Fax - [phone]  [facility] [address] [place], [place]  [address] Phone - [phone]   Fax - [phone]  [facility] [address] [place], [place]  [address] Phone - [phone]   Fax - [phone]  [facility] [address], [address] [place], [place]  [address] Phone - [phone]   Fax - [phone]  SHILPA GOSRANI [address] [place], [place]  [address] Phone - [phone]  [facility] [address] [place], [place]  [address] Phone - [phone]   Fax - [phone]  [facility] [address] [place], [place]  [address] Phone - [phone]   Fax - [phone]  [facility] [address] [place], [place]  [address] Phone - [phone]   Fax - [phone]  [facility] [address] [place], [place]  [address] Phone - [phone]   Fax - [phone]   [facility] [address] [place], [place]  [address] Phone - [phone]   Fax - [phone]  [facility] [address] [place], [place]  [address] Phone - [phone]   Fax - [phone]  [facility] [address] , [place]  [address] Phone - [phone]   Fax - [phone]  [facility] [address] , [place]  [address] Phone - [phone]   Fax - [phone]  [facility] [address] , [place]  [address] Phone - [phone]   Fax - [phone] [facility] [address] [place], [place]  [address] Phone - [phone]   Fax - [phone]  [facility] [address] [place], [place]  [address] Phone - [phone]   Fax - [phone]  [facility] [address] [place],   [address] Phone - [phone]   Fax - [phone]   

## 2016-01-25 LAB — GLUCOSE TOLERANCE, 1 HOUR (50G) W/O FASTING: GLUCOSE, 1 HR, GESTATIONAL: 91 mg/dL (ref <140)

## 2016-01-25 LAB — RPR

## 2016-01-25 LAB — HIV ANTIBODY (ROUTINE TESTING W REFLEX): HIV 1&2 Ab, 4th Generation: NONREACTIVE

## 2016-01-27 LAB — CULTURE, OB URINE

## 2016-01-30 ENCOUNTER — Telehealth: Payer: Self-pay

## 2016-01-30 ENCOUNTER — Other Ambulatory Visit: Payer: Self-pay | Admitting: Family

## 2016-01-30 DIAGNOSIS — O2342 Unspecified infection of urinary tract in pregnancy, second trimester: Secondary | ICD-10-CM

## 2016-01-30 MED ORDER — CEPHALEXIN 500 MG PO CAPS: 500.0000 mg | ORAL_CAPSULE | Freq: Three times a day (TID) | ORAL | Status: DC

## 2016-01-30 NOTE — Telephone Encounter (Signed)
Per WJXBJYNWalidah pt has a UTI and Rx sent to pharmacy.

## 2016-01-30 NOTE — Telephone Encounter (Signed)
Per AOZHYQMWalidah, patient has UTI and antibiotic has been sent to pharmacy. Called patient, no answer- left message stating we are trying to reach you with results. We wanted to inform you that your most recent urine culture shows you have a urinary tract infection & an antibiotic has been [facility] for you to go by and pick up. If you have any questions you may call us back at the clinics

## 2016-02-08 ENCOUNTER — Ambulatory Visit (INDEPENDENT_AMBULATORY_CARE_PROVIDER_SITE_OTHER): Payer: Medicaid Other | Admitting: Family

## 2016-02-08 VITALS — BP 119/63 | HR 89 | Wt 147.6 lb

## 2016-02-08 DIAGNOSIS — O2343 Unspecified infection of urinary tract in pregnancy, third trimester: Secondary | ICD-10-CM

## 2016-02-08 DIAGNOSIS — O2341 Unspecified infection of urinary tract in pregnancy, first trimester: Secondary | ICD-10-CM

## 2016-02-08 DIAGNOSIS — Z3493 Encounter for supervision of normal pregnancy, unspecified, third trimester: Secondary | ICD-10-CM

## 2016-02-08 NOTE — Progress Notes (Signed)
Pt reports increased braxton hicks contractions.

## 2016-02-08 NOTE — Progress Notes (Signed)
Subjective:  Misty Hall is a 30 y.o. [phone] at 3945w0d being seen today for ongoing prenatal care.  She is currently monitored for the following issues for this low-risk pregnancy and has UTI (urinary tract infection) in pregnancy in first trimester; Hemoglobin A-S genotype (HCC); and Supervision of low-risk pregnancy on her problem list.  Patient reports irregular contractions 2-3/hour a couple of times a day.  [place] and will stay x 4 days.  Contractions: Irregular. Vag. Bleeding: None.  Movement: Present. Denies leaking of fluid.   The following portions of the patient's history were reviewed and updated as appropriate: allergies, current medications, past family history, past medical history, past social history, past surgical history and problem list. Problem list updated.  Objective:   Filed Vitals:   02/08/16 0926  BP: 119/63  Pulse: 89  Weight: 147 lb 9.6 oz (66.951 kg)    Fetal Status: Fetal Heart Rate (bpm): 140 Fundal Height: 32 cm Movement: Present     General:  Alert, oriented and cooperative. Patient is in no acute distress.  Skin: Skin is warm and dry. No rash noted.   Cardiovascular: Normal heart rate noted  Respiratory: Normal respiratory effort, no problems with respiration noted  Abdomen: Soft, gravid, appropriate for gestational age. Pain/Pressure: Present     Pelvic: Cervical exam deferred Dilation: Closed Effacement (%): Thick    Extremities: Normal range of motion.  Edema: None  Mental Status: Normal mood and affect. Normal behavior. Normal judgment and thought content.   Urinalysis:     Urine results not available at discharge.     Assessment and Plan:  Pregnancy: [id number] at 8645w0d  1. UTI (urinary tract infection) in pregnancy in first trimester - Taking antibiotics now  2. Supervision of low-risk pregnancy, third trimester - Explained normalcy of Deberah PeltonBraxton Hicks and what to report.  Preterm labor symptoms and general  obstetric precautions including but not limited to vaginal bleeding, contractions, leaking of fluid and fetal movement were reviewed in detail with the patient. Please refer to After Visit Summary for other counseling recommendations.  Return in about 2 weeks (around 02/22/2016).   Eino FarberWalidah Kennith GainN Karim, CNM

## 2016-02-29 ENCOUNTER — Ambulatory Visit (INDEPENDENT_AMBULATORY_CARE_PROVIDER_SITE_OTHER): Payer: Medicaid Other | Admitting: Obstetrics & Gynecology

## 2016-02-29 VITALS — BP 119/65 | HR 90 | Wt 150.0 lb

## 2016-02-29 DIAGNOSIS — O26893 Other specified pregnancy related conditions, third trimester: Secondary | ICD-10-CM

## 2016-02-29 DIAGNOSIS — O99891 Other specified diseases and conditions complicating pregnancy: Secondary | ICD-10-CM

## 2016-02-29 DIAGNOSIS — Z3493 Encounter for supervision of normal pregnancy, unspecified, third trimester: Secondary | ICD-10-CM | POA: Diagnosis not present

## 2016-02-29 DIAGNOSIS — O9989 Other specified diseases and conditions complicating pregnancy, childbirth and the puerperium: Secondary | ICD-10-CM

## 2016-02-29 DIAGNOSIS — M549 Dorsalgia, unspecified: Secondary | ICD-10-CM

## 2016-02-29 LAB — POCT URINALYSIS DIP (DEVICE)
BILIRUBIN URINE: NEGATIVE
Glucose, UA: NEGATIVE mg/dL
Hgb urine dipstick: NEGATIVE
Ketones, ur: NEGATIVE mg/dL
Nitrite: NEGATIVE
Protein, ur: NEGATIVE mg/dL
Specific Gravity, Urine: 1.015 (ref 1.005–1.030)
Urobilinogen, UA: 1 mg/dL (ref 0.0–1.0)
pH: 6 (ref 5.0–8.0)

## 2016-02-29 MED ORDER — CYCLOBENZAPRINE HCL 10 MG PO TABS: 10.0000 mg | ORAL_TABLET | Freq: Three times a day (TID) | ORAL | Status: DC | PRN

## 2016-02-29 NOTE — Progress Notes (Signed)
Subjective:  Misty Hall is a 30 y.o. [phone] at 5394w0d being seen today for ongoing prenatal care.  Patient reports backache.  Contractions: Irritability.  Vag. Bleeding: None. Movement: Present. Denies leaking of fluid.   The following portions of the patient's history were reviewed and updated as appropriate: allergies, current medications, past family history, past medical history, past social history, past surgical history and problem list.   Objective:   Filed Vitals:   02/29/16 1258  BP: 119/65  Pulse: 90  Weight: 150 lb (68.04 kg)    Fetal Status: Fetal Heart Rate (bpm): 141 Fundal Height: 34 cm Movement: Present     General:  Alert, oriented and cooperative. Patient is in no acute distress.  Skin: Skin is warm and dry. No rash noted.   Cardiovascular: Normal heart rate noted  Respiratory: Normal respiratory effort, no problems with respiration noted  Abdomen: Soft, gravid, appropriate for gestational age. Pain/Pressure: Present     Vaginal: Vag. Bleeding: None.       Cervix: Not evaluated        Extremities: Normal range of motion.  Edema: None  Mental Status: Normal mood and affect. Normal behavior. Normal judgment and thought content.   Urinalysis: Urine Protein: Trace Urine Glucose: Negative  Assessment and Plan:  Pregnancy: [id number] at 3694w0d  1. Back pain affecting pregnancy in third trimester Recommended Tylenol as needed. Flexeril prescribed - cyclobenzaprine (FLEXERIL) 10 MG tablet; Take 1 tablet (10 mg total) by mouth every 8 (eight) hours as needed for muscle spasms.  Dispense: 30 tablet; Refill: 1  2. Supervision of low-risk pregnancy, third trimester Preterm labor symptoms and general obstetric precautions including but not limited to vaginal bleeding, contractions, leaking of fluid and fetal movement were reviewed in detail with the patient. Please refer to After Visit Summary for other counseling recommendations.  Return in about 2 weeks (around  03/14/2016) for OB Visit, Pelvic cultures.   Tereso NewcomerUgonna A Brevon Dewald, MD

## 2016-02-29 NOTE — Patient Instructions (Signed)
Return to clinic for any scheduled appointments or obstetric concerns, or go to [facility] for evaluation  

## 2016-02-29 NOTE — Progress Notes (Signed)
Patient reports pelvic pressure and lower back pain  

## 2016-03-14 ENCOUNTER — Other Ambulatory Visit (HOSPITAL_COMMUNITY)
Admission: RE | Admit: 2016-03-14 | Discharge: 2016-03-14 | Disposition: A | Discharge status: Home / Self Care | Payer: Medicaid Other | Source: Ambulatory Visit | Attending: Advanced Practice Midwife | Admitting: Advanced Practice Midwife

## 2016-03-14 ENCOUNTER — Ambulatory Visit (INDEPENDENT_AMBULATORY_CARE_PROVIDER_SITE_OTHER): Payer: Medicaid Other | Admitting: Advanced Practice Midwife

## 2016-03-14 VITALS — BP 119/66 | HR 84 | Wt 154.0 lb

## 2016-03-14 DIAGNOSIS — Z113 Encounter for screening for infections with a predominantly sexual mode of transmission: Secondary | ICD-10-CM | POA: Diagnosis not present

## 2016-03-14 DIAGNOSIS — Z3493 Encounter for supervision of normal pregnancy, unspecified, third trimester: Secondary | ICD-10-CM

## 2016-03-14 LAB — POCT URINALYSIS DIP (DEVICE)
Bilirubin Urine: NEGATIVE
Glucose, UA: NEGATIVE mg/dL
HGB URINE DIPSTICK: NEGATIVE
KETONES UR: NEGATIVE mg/dL
Nitrite: NEGATIVE
PH: 6 (ref 5.0–8.0)
PROTEIN: NEGATIVE mg/dL
SPECIFIC GRAVITY, URINE: 1.02 (ref 1.005–1.030)
UROBILINOGEN UA: 1 mg/dL (ref 0.0–1.0)

## 2016-03-14 LAB — OB RESULTS CONSOLE GC/CHLAMYDIA: GC PROBE AMP, GENITAL: NEGATIVE

## 2016-03-14 NOTE — Progress Notes (Signed)
Subjective:  Misty Hall is a 30 y.o. [phone] at 3335w0d being seen today for ongoing prenatal care.  She is currently monitored for the following issues for this low-risk pregnancy and has UTI (urinary tract infection) in pregnancy in first trimester; Hemoglobin A-S genotype (HCC); and Supervision of low-risk pregnancy on her problem list.  Patient reports backache.  Contractions: Irregular. Vag. Bleeding: None.  Movement: Present. Denies leaking of fluid.   The following portions of the patient's history were reviewed and updated as appropriate: allergies, current medications, past family history, past medical history, past social history, past surgical history and problem list. Problem list updated.  Objective:   Filed Vitals:   03/14/16 1144  BP: 119/66  Pulse: 84  Weight: 154 lb (69.854 kg)    Fetal Status: Fetal Heart Rate (bpm): 147 Fundal Height: 36 cm Movement: Present     General:  Alert, oriented and cooperative. Patient is in no acute distress.  Skin: Skin is warm and dry. No rash noted.   Cardiovascular: Normal heart rate noted  Respiratory: Normal respiratory effort, no problems with respiration noted  Abdomen: Soft, gravid, appropriate for gestational age. Pain/Pressure: Present     Pelvic:  Cervical exam performed       closed thick  Extremities: Normal range of motion.  Edema: None  Mental Status: Normal mood and affect. Normal behavior. Normal judgment and thought content.   Urinalysis:      Assessment and Plan:  Pregnancy: [id number] at 7435w0d  1. Supervision of low-risk pregnancy, third trimester Routine care - Culture, beta strep (group b only) - GC[facility]Chlamydia probe amp ([facility]  Term labor symptoms and general obstetric precautions including but not limited to vaginal bleeding, contractions, leaking of fluid and fetal movement were reviewed in detail with the patient. Please refer to After Visit Summary for other counseling recommendations.   Return in about 1 week (around 03/21/2016) for LOB.   Lorne SkeensNicholas Michael Schenk, MD

## 2016-03-14 NOTE — Patient Instructions (Signed)

## 2016-03-15 ENCOUNTER — Encounter: Payer: Self-pay | Admitting: Advanced Practice Midwife

## 2016-03-15 DIAGNOSIS — O99820 Streptococcus B carrier state complicating pregnancy: Secondary | ICD-10-CM | POA: Insufficient documentation

## 2016-03-15 LAB — GC/CHLAMYDIA PROBE AMP (~~LOC~~) NOT AT ARMC
Chlamydia: NEGATIVE
Neisseria Gonorrhea: NEGATIVE

## 2016-03-15 LAB — CULTURE, BETA STREP (GROUP B ONLY)

## 2016-03-22 ENCOUNTER — Ambulatory Visit (INDEPENDENT_AMBULATORY_CARE_PROVIDER_SITE_OTHER): Payer: Medicaid Other | Admitting: Medical

## 2016-03-22 VITALS — BP 117/59 | HR 83 | Wt 153.6 lb

## 2016-03-22 DIAGNOSIS — Z3493 Encounter for supervision of normal pregnancy, unspecified, third trimester: Secondary | ICD-10-CM

## 2016-03-22 LAB — POCT URINALYSIS DIP (DEVICE)
Bilirubin Urine: NEGATIVE
GLUCOSE, UA: NEGATIVE mg/dL
Ketones, ur: NEGATIVE mg/dL
NITRITE: NEGATIVE
Protein, ur: NEGATIVE mg/dL
Specific Gravity, Urine: 1.015 (ref 1.005–1.030)
UROBILINOGEN UA: 2 mg/dL — AB (ref 0.0–1.0)
pH: 7 (ref 5.0–8.0)

## 2016-03-22 NOTE — Progress Notes (Signed)
Subjective:  Misty Hall is a 30 y.o. [phone] at [redacted]w[redacted]d being seen today for ongoing prenatal care.  She is currently monitored for the following issues for this low-risk pregnancy and has UTI (urinary tract infection) in pregnancy in first trimester; Hemoglobin A-S genotype (HCC); Supervision of low-risk pregnancy; and Group B Streptococcus carrier, antepartum on her problem list.  Patient reports occasional contractions and pubic symphysis pain.  Contractions: Irregular. Vag. Bleeding: None.  Movement: Present. Denies leaking of fluid.   The following portions of the patient's history were reviewed and updated as appropriate: allergies, current medications, past family history, past medical history, past social history, past surgical history and problem list. Problem list updated.  Objective:   Vitals:   03/22/16 1646  BP: (!) 117/59  Pulse: 83  Weight: 153 lb 9.6 oz (69.7 kg)    Fetal Status: Fetal Heart Rate (bpm): 145 Fundal Height: 37 cm Movement: Present  Presentation: Vertex  General:  Alert, oriented and cooperative. Patient is in no acute distress.  Skin: Skin is warm and dry. No rash noted.   Cardiovascular: Normal heart rate noted  Respiratory: Normal respiratory effort, no problems with respiration noted  Abdomen: Soft, gravid, appropriate for gestational age. Pain/Pressure: Present     Pelvic:  Cervical exam performed Dilation: 1.5 Effacement (%): 30 Station: -3  Extremities: Normal range of motion.  Edema: None  Mental Status: Normal mood and affect. Normal behavior. Normal judgment and thought content.   Urinalysis: Urine Protein: Negative Urine Glucose: Negative  Assessment and Plan:  Pregnancy: [id number] at [redacted]w[redacted]d  1. Supervision of low risk pregnancy, third trimester - +GBS in urine earlier in pregnancy, patient advised of need for treatment in labor   Term labor symptoms and general obstetric precautions including but not limited to vaginal bleeding,  contractions, leaking of fluid and fetal movement were reviewed in detail with the patient. Please refer to After Visit Summary for other counseling recommendations.  Return in about 1 week (around 03/29/2016) for LROB.   Marny Lowenstein, PA-C

## 2016-03-22 NOTE — Patient Instructions (Signed)
Fetal Movement Counts °Patient Name: __________________________________________________ Patient Due Date: ____________________ °Performing a fetal movement count is highly recommended in high-risk pregnancies, but it is good for every pregnant woman to do. Your health care provider may ask you to start counting fetal movements at 28 weeks of the pregnancy. Fetal movements often increase: °· After eating a full meal. °· After physical activity. °· After eating or drinking something sweet or cold. °· At rest. °Pay attention to when you feel the baby is most active. This will help you notice a pattern of your baby's sleep and wake cycles and what factors contribute to an increase in fetal movement. It is important to perform a fetal movement count at the same time each day when your baby is normally most active.  °HOW TO COUNT FETAL MOVEMENTS °1. Find a quiet and comfortable area to sit or lie down on your left side. Lying on your left side provides the best blood and oxygen circulation to your baby. °2. Write down the day and time on a sheet of paper or in a journal. °3. Start counting kicks, flutters, swishes, rolls, or jabs in a 2-hour period. You should feel at least 10 movements within 2 hours. °4. If you do not feel 10 movements in 2 hours, wait 2-3 hours and count again. Look for a change in the pattern or not enough counts in 2 hours. °SEEK MEDICAL CARE IF: °· You feel less than 10 counts in 2 hours, tried twice. °· There is no movement in over an hour. °· The pattern is changing or taking longer each day to reach 10 counts in 2 hours. °· You feel the baby is not moving as he or she usually does. °Date: ____________ Movements: ____________ Start time: ____________ Finish time: ____________  °Date: ____________ Movements: ____________ Start time: ____________ Finish time: ____________ °Date: ____________ Movements: ____________ Start time: ____________ Finish time: ____________ °Date: ____________ Movements:  ____________ Start time: ____________ Finish time: ____________ °Date: ____________ Movements: ____________ Start time: ____________ Finish time: ____________ °Date: ____________ Movements: ____________ Start time: ____________ Finish time: ____________ °Date: ____________ Movements: ____________ Start time: ____________ Finish time: ____________ °Date: ____________ Movements: ____________ Start time: ____________ Finish time: ____________  °Date: ____________ Movements: ____________ Start time: ____________ Finish time: ____________ °Date: ____________ Movements: ____________ Start time: ____________ Finish time: ____________ °Date: ____________ Movements: ____________ Start time: ____________ Finish time: ____________ °Date: ____________ Movements: ____________ Start time: ____________ Finish time: ____________ °Date: ____________ Movements: ____________ Start time: ____________ Finish time: ____________ °Date: ____________ Movements: ____________ Start time: ____________ Finish time: ____________ °Date: ____________ Movements: ____________ Start time: ____________ Finish time: ____________  °Date: ____________ Movements: ____________ Start time: ____________ Finish time: ____________ °Date: ____________ Movements: ____________ Start time: ____________ Finish time: ____________ °Date: ____________ Movements: ____________ Start time: ____________ Finish time: ____________ °Date: ____________ Movements: ____________ Start time: ____________ Finish time: ____________ °Date: ____________ Movements: ____________ Start time: ____________ Finish time: ____________ °Date: ____________ Movements: ____________ Start time: ____________ Finish time: ____________ °Date: ____________ Movements: ____________ Start time: ____________ Finish time: ____________  °Date: ____________ Movements: ____________ Start time: ____________ Finish time: ____________ °Date: ____________ Movements: ____________ Start time: ____________ Finish  time: ____________ °Date: ____________ Movements: ____________ Start time: ____________ Finish time: ____________ °Date: ____________ Movements: ____________ Start time: ____________ Finish time: ____________ °Date: ____________ Movements: ____________ Start time: ____________ Finish time: ____________ °Date: ____________ Movements: ____________ Start time: ____________ Finish time: ____________ °Date: ____________ Movements: ____________ Start time: ____________ Finish time: ____________  °Date: ____________ Movements: ____________ Start time: ____________ Finish   time: ____________ °Date: ____________ Movements: ____________ Start time: ____________ Finish time: ____________ °Date: ____________ Movements: ____________ Start time: ____________ Finish time: ____________ °Date: ____________ Movements: ____________ Start time: ____________ Finish time: ____________ °Date: ____________ Movements: ____________ Start time: ____________ Finish time: ____________ °Date: ____________ Movements: ____________ Start time: ____________ Finish time: ____________ °Date: ____________ Movements: ____________ Start time: ____________ Finish time: ____________  °Date: ____________ Movements: ____________ Start time: ____________ Finish time: ____________ °Date: ____________ Movements: ____________ Start time: ____________ Finish time: ____________ °Date: ____________ Movements: ____________ Start time: ____________ Finish time: ____________ °Date: ____________ Movements: ____________ Start time: ____________ Finish time: ____________ °Date: ____________ Movements: ____________ Start time: ____________ Finish time: ____________ °Date: ____________ Movements: ____________ Start time: ____________ Finish time: ____________ °Date: ____________ Movements: ____________ Start time: ____________ Finish time: ____________  °Date: ____________ Movements: ____________ Start time: ____________ Finish time: ____________ °Date: ____________  Movements: ____________ Start time: ____________ Finish time: ____________ °Date: ____________ Movements: ____________ Start time: ____________ Finish time: ____________ °Date: ____________ Movements: ____________ Start time: ____________ Finish time: ____________ °Date: ____________ Movements: ____________ Start time: ____________ Finish time: ____________ °Date: ____________ Movements: ____________ Start time: ____________ Finish time: ____________ °Date: ____________ Movements: ____________ Start time: ____________ Finish time: ____________  °Date: ____________ Movements: ____________ Start time: ____________ Finish time: ____________ °Date: ____________ Movements: ____________ Start time: ____________ Finish time: ____________ °Date: ____________ Movements: ____________ Start time: ____________ Finish time: ____________ °Date: ____________ Movements: ____________ Start time: ____________ Finish time: ____________ °Date: ____________ Movements: ____________ Start time: ____________ Finish time: ____________ °Date: ____________ Movements: ____________ Start time: ____________ Finish time: ____________ °  °This information is not intended to replace advice given to you by your health care provider. Make sure you discuss any questions you have with your health care provider. °  °Document Released: 09/12/2006 Document Revised: 09/03/2014 Document Reviewed: 06/09/2012 °Elsevier Interactive Patient Education ©2016 [employer] °Braxton Hicks Contractions °Contractions of the uterus can occur throughout pregnancy. Contractions are not always a sign that you are in labor.  °WHAT ARE BRAXTON HICKS CONTRACTIONS?  °Contractions that occur before labor are called Braxton Hicks contractions, or false labor. Toward the end of pregnancy (32-34 weeks), these contractions can develop more often and may become more forceful. This is not true labor because these contractions do not result in opening (dilatation) and thinning of  the cervix. They are sometimes difficult to tell apart from true labor because these contractions can be forceful and people have different pain tolerances. You should not feel embarrassed if you go to the hospital with false labor. Sometimes, the only way to tell if you are in true labor is for your health care provider to look for changes in the cervix. °If there are no prenatal problems or other health problems associated with the pregnancy, it is completely safe to be sent home with false labor and await the onset of true labor. °HOW CAN YOU TELL THE DIFFERENCE BETWEEN TRUE AND FALSE LABOR? °False Labor °· The contractions of false labor are usually shorter and not as hard as those of true labor.   °· The contractions are usually irregular.   °· The contractions are often felt in the front of the lower abdomen and in the groin.   °· The contractions may go away when you walk around or change positions while lying down.   °· The contractions get weaker and are shorter lasting as time goes on.   °· The contractions do not usually become progressively stronger, regular, and closer together as with true labor.   °True Labor °· Contractions in true   labor last 30-70 seconds, become very regular, usually become more intense, and increase in frequency.   °· The contractions do not go away with walking.   °· The discomfort is usually felt in the top of the uterus and spreads to the lower abdomen and low back.   °· True labor can be determined by your health care provider with an exam. This will show that the cervix is dilating and getting thinner.   °WHAT TO REMEMBER °· Keep up with your usual exercises and follow other instructions given by your health care provider.   °· Take medicines as directed by your health care provider.   °· Keep your regular prenatal appointments.   °· Eat and drink lightly if you think you are going into labor.   °· If Braxton Hicks contractions are making you uncomfortable:   °¨ Change your  position from lying down or resting to walking, or from walking to resting.   °¨ Sit and rest in a tub of warm water.   °¨ Drink 2-3 glasses of water. Dehydration may cause these contractions.   °¨ Do slow and deep breathing several times an hour.   °WHEN SHOULD I SEEK IMMEDIATE MEDICAL CARE? °Seek immediate medical care if: °· Your contractions become stronger, more regular, and closer together.   °· You have fluid leaking or gushing from your vagina.   °· You have a fever.   °· You pass blood-tinged mucus.   °· You have vaginal bleeding.   °· You have continuous abdominal pain.   °· You have low back pain that you never had before.   °· You feel your baby's head pushing down and causing pelvic pressure.   °· Your baby is not moving as much as it used to.   °  °This information is not intended to replace advice given to you by your health care provider. Make sure you discuss any questions you have with your health care provider. °  °Document Released: 08/13/2005 Document Revised: 08/18/2013 Document Reviewed: 05/25/2013 °Elsevier Interactive Patient Education ©2016 [employer] Inc[employer] ° °

## 2016-03-26 ENCOUNTER — Ambulatory Visit (INDEPENDENT_AMBULATORY_CARE_PROVIDER_SITE_OTHER): Payer: Medicaid Other | Admitting: Obstetrics & Gynecology

## 2016-03-26 VITALS — BP 106/62 | HR 81 | Temp 98.5°F | Wt 150.8 lb

## 2016-03-26 DIAGNOSIS — Z3483 Encounter for supervision of other normal pregnancy, third trimester: Secondary | ICD-10-CM | POA: Diagnosis present

## 2016-03-26 DIAGNOSIS — Z3493 Encounter for supervision of normal pregnancy, unspecified, third trimester: Secondary | ICD-10-CM

## 2016-03-26 LAB — POCT URINALYSIS DIP (DEVICE)
Bilirubin Urine: NEGATIVE
GLUCOSE, UA: NEGATIVE mg/dL
KETONES UR: NEGATIVE mg/dL
Nitrite: NEGATIVE
Protein, ur: NEGATIVE mg/dL
Specific Gravity, Urine: 1.015 (ref 1.005–1.030)
Urobilinogen, UA: 4 mg/dL — ABNORMAL HIGH (ref 0.0–1.0)
pH: 7 (ref 5.0–8.0)

## 2016-03-26 NOTE — Progress Notes (Signed)
Subjective:  Misty Hall is a 30 y.o. [phone] at [redacted]w[redacted]d being seen today for ongoing prenatal care.  She is currently monitored for the following issues for this low-risk pregnancy and has UTI (urinary tract infection) in pregnancy in first trimester; Hemoglobin A-S genotype (HCC); Supervision of low-risk pregnancy; and Group B Streptococcus carrier, antepartum on her problem list.  Patient reports irreg contractions that are painful  Contractions: Irregular. Vag. Bleeding: None.  Movement: Present. Denies leaking of fluid.   The following portions of the patient's history were reviewed and updated as appropriate: allergies, current medications, past family history, past medical history, past social history, past surgical history and problem list. Problem list updated.  Objective:   Vitals:   03/26/16 1430  BP: 106/62  Pulse: 81  Temp: 98.5 F (36.9 C)  Weight: 150 lb 12.8 oz (68.4 kg)    Fetal Status: Fetal Heart Rate (bpm): 145   Movement: Present     General:  Alert, oriented and cooperative. Patient is in no acute distress.  Skin: Skin is warm and dry. No rash noted.   Cardiovascular: Normal heart rate noted  Respiratory: Normal respiratory effort, no problems with respiration noted  Abdomen: Soft, gravid, appropriate for gestational age. Pain/Pressure: Present     Pelvic:  Cervical exam performed        Extremities: Normal range of motion.  Edema: None  Mental Status: Normal mood and affect. Normal behavior. Normal judgment and thought content.   Urinalysis:      Assessment and Plan:  Pregnancy: [id number] at [redacted]w[redacted]d Supervision of normal pregnancy Pt with BH ctx + GBS in urine Pt needs atbx in labor  There are no diagnoses linked to this encounter. Term labor symptoms and general obstetric precautions including but not limited to vaginal bleeding, contractions, leaking of fluid and fetal movement were reviewed in detail with the patient. Please refer to After Visit  Summary for other counseling recommendations.  Return in about 1 week (around 04/02/2016).   Willodean Rosenthal, MD

## 2016-03-26 NOTE — Patient Instructions (Signed)

## 2016-04-05 ENCOUNTER — Ambulatory Visit (INDEPENDENT_AMBULATORY_CARE_PROVIDER_SITE_OTHER): Payer: Medicaid Other | Admitting: Family Medicine

## 2016-04-05 VITALS — BP 122/73 | HR 90 | Wt 153.1 lb

## 2016-04-05 DIAGNOSIS — O9913 Other diseases of the blood and blood-forming organs and certain disorders involving the immune mechanism complicating the puerperium: Secondary | ICD-10-CM

## 2016-04-05 DIAGNOSIS — O99820 Streptococcus B carrier state complicating pregnancy: Secondary | ICD-10-CM

## 2016-04-05 DIAGNOSIS — D573 Sickle-cell trait: Secondary | ICD-10-CM

## 2016-04-05 DIAGNOSIS — Z3493 Encounter for supervision of normal pregnancy, unspecified, third trimester: Secondary | ICD-10-CM

## 2016-04-05 LAB — POCT URINALYSIS DIP (DEVICE)
BILIRUBIN URINE: NEGATIVE
Glucose, UA: 250 mg/dL — AB
KETONES UR: NEGATIVE mg/dL
Nitrite: NEGATIVE
PH: 5.5 (ref 5.0–8.0)
PROTEIN: NEGATIVE mg/dL
SPECIFIC GRAVITY, URINE: 1.015 (ref 1.005–1.030)
Urobilinogen, UA: 1 mg/dL (ref 0.0–1.0)

## 2016-04-05 LAB — GLUCOSE, CAPILLARY: Glucose-Capillary: 89 mg/dL (ref 65–99)

## 2016-04-05 NOTE — Progress Notes (Signed)
Subjective:  Misty Hall is a 30 y.o. [phone] at 2079w1d being seen today for ongoing prenatal care.  She is currently monitored for the following issues for this low-risk pregnancy and has UTI (urinary tract infection) in pregnancy in first trimester; Hemoglobin A-S genotype (HCC); Supervision of low-risk pregnancy; and Group B Streptococcus carrier, antepartum on her problem list.  Patient reports contractions since 2 weeks, getting stronger or more frequent.  Contractions: Irregular. Vag. Bleeding: None.  Movement: Present. Denies leaking of fluid.   The following portions of the patient's history were reviewed and updated as appropriate: allergies, current medications, past family history, past medical history, past social history, past surgical history and problem list. Problem list updated.  Objective:   Vitals:   04/05/16 1446  BP: 122/73  Pulse: 90  Weight: 153 lb 1.6 oz (69.4 kg)    Fetal Status: Fetal Heart Rate (bpm): 140   Movement: Present     General:  Alert, oriented and cooperative. Patient is in no acute distress.  Skin: Skin is warm and dry. No rash noted.   Cardiovascular: Normal heart rate noted  Respiratory: Normal respiratory effort, no problems with respiration noted  Abdomen: Soft, gravid, appropriate for gestational age. Pain/Pressure: Present     Pelvic:  Cervical exam performed       1.5/30/-3; Cephalic  Extremities: Normal range of motion.  Edema: None  Mental Status: Normal mood and affect. Normal behavior. Normal judgment and thought content.   Urinalysis: Urine Protein: Negative Urine Glucose: 2+  Assessment and Plan:  Pregnancy: [id number] at 7579w1d  1. Supervision of low-risk pregnancy, third trimester  2. Hemoglobin A-S genotype (HCC) -Trait  3. Group B Streptococcus carrier, antepartum - Treat at delivery  Term labor symptoms and general obstetric precautions including but not limited to vaginal bleeding, contractions, leaking of fluid and  fetal movement were reviewed in detail with the patient. Please refer to After Visit Summary for other counseling recommendations.  Return in about 1 week (around 04/12/2016) for Routine OB visit.   [facility] MidwestElizabeth Woodland CliveMumaw, [place]

## 2016-04-05 NOTE — Patient Instructions (Signed)

## 2016-04-06 ENCOUNTER — Inpatient Hospital Stay (HOSPITAL_COMMUNITY)
Admission: AD | Admit: 2016-04-06 | Discharge: 2016-04-06 | Disposition: A | Discharge status: Home / Self Care | Payer: Medicaid Other | Source: Ambulatory Visit | Attending: Family Medicine | Admitting: Family Medicine

## 2016-04-06 ENCOUNTER — Encounter (HOSPITAL_COMMUNITY): Payer: Self-pay | Admitting: *Deleted

## 2016-04-06 DIAGNOSIS — N939 Abnormal uterine and vaginal bleeding, unspecified: Secondary | ICD-10-CM | POA: Diagnosis present

## 2016-04-06 DIAGNOSIS — O99820 Streptococcus B carrier state complicating pregnancy: Secondary | ICD-10-CM

## 2016-04-06 DIAGNOSIS — O2341 Unspecified infection of urinary tract in pregnancy, first trimester: Secondary | ICD-10-CM

## 2016-04-06 DIAGNOSIS — O4693 Antepartum hemorrhage, unspecified, third trimester: Secondary | ICD-10-CM | POA: Diagnosis not present

## 2016-04-06 DIAGNOSIS — Z3A39 39 weeks gestation of pregnancy: Secondary | ICD-10-CM | POA: Insufficient documentation

## 2016-04-06 NOTE — Progress Notes (Signed)
Dr. Chanetta Marshallimberlake notified of pt in MAU.  Notified that pt is a G4P3 at 3588w3d.  Notified that pt came in with complaints of vaginal bleeding.  Notified that pt states she has been having some bleeding when she wipes and she had three stingy clots that were about 2 inches long.  Notified that a cervical exam was preformed and she was the same as yesterday.  She was 1.5, 30, -3, and vertex with no bleeding noted on the RN's glove.  Notified that pt has had one contraction on the monitor and baby looks good.  Provider states pt can be discharged.

## 2016-04-06 NOTE — MAU Note (Signed)
Pt states she is having some vaginal bleeding.  Pt states she sees it when she wipes.  Pt states she has passed three clots.  Pt states they were thick and long.  Pt states they were about two inch long and skinny.  Pt states she is having irregular contractions.  Pt denies any leaking of fluid and states she does not feel like her water broke.

## 2016-04-06 NOTE — Discharge Instructions (Signed)
Braxton Hicks Contractions °Contractions of the uterus can occur throughout pregnancy. Contractions are not always a sign that you are in labor.  °WHAT ARE BRAXTON HICKS CONTRACTIONS?  °Contractions that occur before labor are called Braxton Hicks contractions, or false labor. Toward the end of pregnancy (32-34 weeks), these contractions can develop more often and may become more forceful. This is not true labor because these contractions do not result in opening (dilatation) and thinning of the cervix. They are sometimes difficult to tell apart from true labor because these contractions can be forceful and people have different pain tolerances. You should not feel embarrassed if you go to the hospital with false labor. Sometimes, the only way to tell if you are in true labor is for your health care provider to look for changes in the cervix. °If there are no prenatal problems or other health problems associated with the pregnancy, it is completely safe to be sent home with false labor and await the onset of true labor. °HOW CAN YOU TELL THE DIFFERENCE BETWEEN TRUE AND FALSE LABOR? °False Labor °· The contractions of false labor are usually shorter and not as hard as those of true labor.   °· The contractions are usually irregular.   °· The contractions are often felt in the front of the lower abdomen and in the groin.   °· The contractions may go away when you walk around or change positions while lying down.   °· The contractions get weaker and are shorter lasting as time goes on.   °· The contractions do not usually become progressively stronger, regular, and closer together as with true labor.   °True Labor °· Contractions in true labor last 30-70 seconds, become very regular, usually become more intense, and increase in frequency.   °· The contractions do not go away with walking.   °· The discomfort is usually felt in the top of the uterus and spreads to the lower abdomen and low back.   °· True labor can be  determined by your health care provider with an exam. This will show that the cervix is dilating and getting thinner.   °WHAT TO REMEMBER °· Keep up with your usual exercises and follow other instructions given by your health care provider.   °· Take medicines as directed by your health care provider.   °· Keep your regular prenatal appointments.   °· Eat and drink lightly if you think you are going into labor.   °· If Braxton Hicks contractions are making you uncomfortable:   °¨ Change your position from lying down or resting to walking, or from walking to resting.   °¨ Sit and rest in a tub of warm water.   °¨ Drink 2-3 glasses of water. Dehydration may cause these contractions.   °¨ Do slow and deep breathing several times an hour.   °WHEN SHOULD I SEEK IMMEDIATE MEDICAL CARE? °Seek immediate medical care if: °· Your contractions become stronger, more regular, and closer together.   °· You have fluid leaking or gushing from your vagina.   °· You have a fever.   °· You pass blood-tinged mucus.   °· You have vaginal bleeding.   °· You have continuous abdominal pain.   °· You have low back pain that you never had before.   °· You feel your baby's head pushing down and causing pelvic pressure.   °· Your baby is not moving as much as it used to.   °  °This information is not intended to replace advice given to you by your health care provider. Make sure you discuss any questions you have with your health care   provider. °  °Document Released: 08/13/2005 Document Revised: 08/18/2013 Document Reviewed: 05/25/2013 °Elsevier Interactive Patient Education ©2016 [employer] °Fetal Movement Counts °Patient Name: __________________________________________________ Patient Due Date: ____________________ °Performing a fetal movement count is highly recommended in high-risk pregnancies, but it is good for every pregnant woman to do. Your health care provider may ask you to start counting fetal movements at 28 weeks of the  pregnancy. Fetal movements often increase: °· After eating a full meal. °· After physical activity. °· After eating or drinking something sweet or cold. °· At rest. °Pay attention to when you feel the baby is most active. This will help you notice a pattern of your baby's sleep and wake cycles and what factors contribute to an increase in fetal movement. It is important to perform a fetal movement count at the same time each day when your baby is normally most active.  °HOW TO COUNT FETAL MOVEMENTS °1. Find a quiet and comfortable area to sit or lie down on your left side. Lying on your left side provides the best blood and oxygen circulation to your baby. °2. Write down the day and time on a sheet of paper or in a journal. °3. Start counting kicks, flutters, swishes, rolls, or jabs in a 2-hour period. You should feel at least 10 movements within 2 hours. °4. If you do not feel 10 movements in 2 hours, wait 2-3 hours and count again. Look for a change in the pattern or not enough counts in 2 hours. °SEEK MEDICAL CARE IF: °· You feel less than 10 counts in 2 hours, tried twice. °· There is no movement in over an hour. °· The pattern is changing or taking longer each day to reach 10 counts in 2 hours. °· You feel the baby is not moving as he or she usually does. °Date: ____________ Movements: ____________ Start time: ____________ Finish time: ____________  °Date: ____________ Movements: ____________ Start time: ____________ Finish time: ____________ °Date: ____________ Movements: ____________ Start time: ____________ Finish time: ____________ °Date: ____________ Movements: ____________ Start time: ____________ Finish time: ____________ °Date: ____________ Movements: ____________ Start time: ____________ Finish time: ____________ °Date: ____________ Movements: ____________ Start time: ____________ Finish time: ____________ °Date: ____________ Movements: ____________ Start time: ____________ Finish time:  ____________ °Date: ____________ Movements: ____________ Start time: ____________ Finish time: ____________  °Date: ____________ Movements: ____________ Start time: ____________ Finish time: ____________ °Date: ____________ Movements: ____________ Start time: ____________ Finish time: ____________ °Date: ____________ Movements: ____________ Start time: ____________ Finish time: ____________ °Date: ____________ Movements: ____________ Start time: ____________ Finish time: ____________ °Date: ____________ Movements: ____________ Start time: ____________ Finish time: ____________ °Date: ____________ Movements: ____________ Start time: ____________ Finish time: ____________ °Date: ____________ Movements: ____________ Start time: ____________ Finish time: ____________  °Date: ____________ Movements: ____________ Start time: ____________ Finish time: ____________ °Date: ____________ Movements: ____________ Start time: ____________ Finish time: ____________ °Date: ____________ Movements: ____________ Start time: ____________ Finish time: ____________ °Date: ____________ Movements: ____________ Start time: ____________ Finish time: ____________ °Date: ____________ Movements: ____________ Start time: ____________ Finish time: ____________ °Date: ____________ Movements: ____________ Start time: ____________ Finish time: ____________ °Date: ____________ Movements: ____________ Start time: ____________ Finish time: ____________  °Date: ____________ Movements: ____________ Start time: ____________ Finish time: ____________ °Date: ____________ Movements: ____________ Start time: ____________ Finish time: ____________ °Date: ____________ Movements: ____________ Start time: ____________ Finish time: ____________ °Date: ____________ Movements: ____________ Start time: ____________ Finish time: ____________ °Date: ____________ Movements: ____________ Start time: ____________ Finish time: ____________ °Date: ____________ Movements:  ____________ Start time: ____________ Finish time:   ____________ Date: ____________ Movements: ____________ Start time: ____________ Doreatha MartinFinish time: ____________  Date: ____________ Movements: ____________ Start time: ____________ Doreatha MartinFinish time: ____________ Date: ____________ Movements: ____________ Start time: ____________ Doreatha MartinFinish time: ____________ Date: ____________ Movements: ____________ Start time: ____________ Doreatha MartinFinish time: ____________ Date: ____________ Movements: ____________ Start time: ____________ Doreatha MartinFinish time: ____________ Date: ____________ Movements: ____________ Start time: ____________ Doreatha MartinFinish time: ____________ Date: ____________ Movements: ____________ Start time: ____________ Doreatha MartinFinish time: ____________ Date: ____________ Movements: ____________ Start time: ____________ Doreatha MartinFinish time: ____________  Date: ____________ Movements: ____________ Start time: ____________ Doreatha MartinFinish time: ____________ Date: ____________ Movements: ____________ Start time: ____________ Doreatha MartinFinish time: ____________ Date: ____________ Movements: ____________ Start time: ____________ Doreatha MartinFinish time: ____________ Date: ____________ Movements: ____________ Start time: ____________ Doreatha MartinFinish time: ____________ Date: ____________ Movements: ____________ Start time: ____________ Doreatha MartinFinish time: ____________ Date: ____________ Movements: ____________ Start time: ____________ Doreatha MartinFinish time: ____________ Date: ____________ Movements: ____________ Start time: ____________ Doreatha MartinFinish time: ____________  Date: ____________ Movements: ____________ Start time: ____________ Doreatha MartinFinish time: ____________ Date: ____________ Movements: ____________ Start time: ____________ Doreatha MartinFinish time: ____________ Date: ____________ Movements: ____________ Start time: ____________ Doreatha MartinFinish time: ____________ Date: ____________ Movements: ____________ Start time: ____________ Doreatha MartinFinish time: ____________ Date: ____________ Movements: ____________ Start time: ____________ Doreatha MartinFinish  time: ____________ Date: ____________ Movements: ____________ Start time: ____________ Doreatha MartinFinish time: ____________ Date: ____________ Movements: ____________ Start time: ____________ Doreatha MartinFinish time: ____________  Date: ____________ Movements: ____________ Start time: ____________ Doreatha MartinFinish time: ____________ Date: ____________ Movements: ____________ Start time: ____________ Doreatha MartinFinish time: ____________ Date: ____________ Movements: ____________ Start time: ____________ Doreatha MartinFinish time: ____________ Date: ____________ Movements: ____________ Start time: ____________ Doreatha MartinFinish time: ____________ Date: ____________ Movements: ____________ Start time: ____________ Doreatha MartinFinish time: ____________ Date: ____________ Movements: ____________ Start time: ____________ Doreatha MartinFinish time: ____________   This information is not intended to replace advice given to you by your health care provider. Make sure you discuss any questions you have with your health care provider.   Document Released: 09/12/2006 Document Revised: 09/03/2014 Document Reviewed: 06/09/2012 Elsevier Interactive Patient Education [employer]2016 [employer] Third Trimester of Pregnancy The third trimester is from week 29 through week 42, months 7 through 9. The third trimester is a time when the fetus is growing rapidly. At the end of the ninth month, the fetus is about 20 inches in length and weighs 6-10 pounds.  BODY CHANGES Your body goes through many changes during pregnancy. The changes vary from woman to woman.   Your weight will continue to increase. You can expect to gain 25-35 pounds (11-16 kg) by the end of the pregnancy.  You may begin to get stretch marks on your hips, abdomen, and breasts.  You may urinate more often because the fetus is moving lower into your pelvis and pressing on your bladder.  You may develop or continue to have heartburn as a result of your pregnancy.  You may develop constipation because certain hormones are causing the muscles that  push waste through your intestines to slow down.  You may develop hemorrhoids or swollen, bulging veins (varicose veins).  You may have pelvic pain because of the weight gain and pregnancy hormones relaxing your joints between the bones in your pelvis. Backaches may result from overexertion of the muscles supporting your posture.  You may have changes in your hair. These can include thickening of your hair, rapid growth, and changes in texture. Some women also have hair loss during or after pregnancy, or hair that feels dry or thin. Your hair will most likely return to normal after your baby is born.  Your breasts will continue to grow and  be tender. A yellow discharge may leak from your breasts called colostrum.  Your belly button may stick out.  You may feel short of breath because of your expanding uterus.  You may notice the fetus "dropping," or moving lower in your abdomen.  You may have a bloody mucus discharge. This usually occurs a few days to a week before labor begins.  Your cervix becomes thin and soft (effaced) near your due date. WHAT TO EXPECT AT YOUR PRENATAL EXAMS  You will have prenatal exams every 2 weeks until week 36. Then, you will have weekly prenatal exams. During a routine prenatal visit:  You will be weighed to make sure you and the fetus are growing normally.  Your blood pressure is taken.  Your abdomen will be measured to track your baby's growth.  The fetal heartbeat will be listened to.  Any test results from the previous visit will be discussed.  You may have a cervical check near your due date to see if you have effaced. At around 36 weeks, your caregiver will check your cervix. At the same time, your caregiver will also perform a test on the secretions of the vaginal tissue. This test is to determine if a type of bacteria, Group B streptococcus, is present. Your caregiver will explain this further. Your caregiver may ask you:  What your birth plan  is.  How you are feeling.  If you are feeling the baby move.  If you have had any abnormal symptoms, such as leaking fluid, bleeding, severe headaches, or abdominal cramping.  If you are using any tobacco products, including cigarettes, chewing tobacco, and electronic cigarettes.  If you have any questions. Other tests or screenings that may be performed during your third trimester include:  Blood tests that check for low iron levels (anemia).  Fetal testing to check the health, activity level, and growth of the fetus. Testing is done if you have certain medical conditions or if there are problems during the pregnancy.  HIV (human immunodeficiency virus) testing. If you are at high risk, you may be screened for HIV during your third trimester of pregnancy. FALSE LABOR You may feel small, irregular contractions that eventually go away. These are called Braxton Hicks contractions, or false labor. Contractions may last for hours, days, or even weeks before true labor sets in. If contractions come at regular intervals, intensify, or become painful, it is best to be seen by your caregiver.  SIGNS OF LABOR   Menstrual-like cramps.  Contractions that are 5 minutes apart or less.  Contractions that start on the top of the uterus and spread down to the lower abdomen and back.  A sense of increased pelvic pressure or back pain.  A watery or bloody mucus discharge that comes from the vagina. If you have any of these signs before the 37th week of pregnancy, call your caregiver right away. You need to go to the hospital to get checked immediately. HOME CARE INSTRUCTIONS   Avoid all smoking, herbs, alcohol, and unprescribed drugs. These chemicals affect the formation and growth of the baby.  Do not use any tobacco products, including cigarettes, chewing tobacco, and electronic cigarettes. If you need help quitting, ask your health care provider. You may receive counseling support and other  resources to help you quit.  Follow your caregiver's instructions regarding medicine use. There are medicines that are either safe or unsafe to take during pregnancy.  Exercise only as directed by your caregiver. Experiencing uterine cramps is  a good sign to stop exercising. °· Continue to eat regular, healthy meals. °· Wear a good support bra for breast tenderness. °· Do not use hot tubs, steam rooms, or saunas. °· Wear your seat belt at all times when driving. °· Avoid raw meat, uncooked cheese, cat litter boxes, and soil used by cats. These carry germs that can cause birth defects in the baby. °· Take your prenatal vitamins. °· Take 1500-2000 mg of calcium daily starting at the 20th week of pregnancy until you deliver your baby. °· Try taking a stool softener (if your caregiver approves) if you develop constipation. Eat more high-fiber foods, such as fresh vegetables or fruit and whole grains. Drink plenty of fluids to keep your urine clear or pale yellow. °· Take warm sitz baths to soothe any pain or discomfort caused by hemorrhoids. Use hemorrhoid cream if your caregiver approves. °· If you develop varicose veins, wear support hose. Elevate your feet for 15 minutes, 3-4 times a day. Limit salt in your diet. °· Avoid heavy lifting, wear low heal shoes, and practice good posture. °· Rest a lot with your legs elevated if you have leg cramps or low back pain. °· Visit your dentist if you have not gone during your pregnancy. Use a soft toothbrush to brush your teeth and be gentle when you floss. °· A sexual relationship may be continued unless your caregiver directs you otherwise. °· Do not travel far distances unless it is absolutely necessary and only with the approval of your caregiver. °· Take prenatal classes to understand, practice, and ask questions about the labor and delivery. °· Make a trial run to the hospital. °· Pack your hospital bag. °· Prepare the baby's nursery. °· Continue to go to all your  prenatal visits as directed by your caregiver. °SEEK MEDICAL CARE IF: °· You are unsure if you are in labor or if your water has broken. °· You have dizziness. °· You have mild pelvic cramps, pelvic pressure, or nagging pain in your abdominal area. °· You have persistent nausea, vomiting, or diarrhea. °· You have a bad smelling vaginal discharge. °· You have pain with urination. °SEEK IMMEDIATE MEDICAL CARE IF:  °· You have a fever. °· You are leaking fluid from your vagina. °· You have spotting or bleeding from your vagina. °· You have severe abdominal cramping or pain. °· You have rapid weight loss or gain. °· You have shortness of breath with chest pain. °· You notice sudden or extreme swelling of your face, hands, ankles, feet, or legs. °· You have not felt your baby move in over an hour. °· You have severe headaches that do not go away with medicine. °· You have vision changes. °  °This information is not intended to replace advice given to you by your health care provider. Make sure you discuss any questions you have with your health care provider. °  °Document Released: 08/07/2001 Document Revised: 09/03/2014 Document Reviewed: 10/14/2012 °Elsevier Interactive Patient Education ©2016 Else[employer] Inc. ° °

## 2016-04-12 ENCOUNTER — Ambulatory Visit (INDEPENDENT_AMBULATORY_CARE_PROVIDER_SITE_OTHER): Payer: Medicaid Other | Admitting: Obstetrics & Gynecology

## 2016-04-12 VITALS — BP 128/75 | HR 88 | Wt 150.3 lb

## 2016-04-12 DIAGNOSIS — Z3493 Encounter for supervision of normal pregnancy, unspecified, third trimester: Secondary | ICD-10-CM | POA: Diagnosis present

## 2016-04-12 DIAGNOSIS — O480 Post-term pregnancy: Secondary | ICD-10-CM

## 2016-04-12 LAB — POCT URINALYSIS DIP (DEVICE)
BILIRUBIN URINE: NEGATIVE
Glucose, UA: NEGATIVE mg/dL
Ketones, ur: NEGATIVE mg/dL
Nitrite: NEGATIVE
PROTEIN: 30 mg/dL — AB
Specific Gravity, Urine: 1.01 (ref 1.005–1.030)
Urobilinogen, UA: 2 mg/dL — ABNORMAL HIGH (ref 0.0–1.0)
pH: 6.5 (ref 5.0–8.0)

## 2016-04-12 NOTE — Progress Notes (Signed)
Subjective:  Misty Hall is a 30 y.o. [phone] at 6258w1d being seen today for ongoing prenatal care.  She is currently monitored for the following issues for this low-risk pregnancy and has UTI (urinary tract infection) in pregnancy in first trimester; Hemoglobin A-S genotype (HCC); Supervision of low-risk pregnancy; and Group B Streptococcus carrier, antepartum on her problem list.  Patient reports no complaints.  Contractions: Irregular. Vag. Bleeding: None.  Movement: Present. Denies leaking of fluid.   The following portions of the patient's history were reviewed and updated as appropriate: allergies, current medications, past family history, past medical history, past social history, past surgical history and problem list. Problem list updated.  Objective:   Vitals:   04/12/16 1048  BP: 128/75  Pulse: 88  Weight: 150 lb 4.8 oz (68.2 kg)    Fetal Status: Fetal Heart Rate (bpm): 134 Fundal Height: 40 cm Movement: Present  Presentation: Vertex  General:  Alert, oriented and cooperative. Patient is in no acute distress.  Skin: Skin is warm and dry. No rash noted.   Cardiovascular: Normal heart rate noted  Respiratory: Normal respiratory effort, no problems with respiration noted  Abdomen: Soft, gravid, appropriate for gestational age. Pain/Pressure: Present     Pelvic:  Cervical exam performed Dilation: 2 Effacement (%): 50 Station: -3  Extremities: Normal range of motion.  Edema: None  Mental Status: Normal mood and affect. Normal behavior. Normal judgment and thought content.   Urinalysis: Urine Protein: Negative Urine Glucose: Negative NST performed today was reviewed and was found to be reactive.  Continue recommended antenatal testing and prenatal care.  Assessment and Plan:  Pregnancy: [id number] at 6658w1d  1. Post term pregnancy over 40 weeks - Fetal nonstress test  2. Supervision of low-risk pregnancy, third trimester Term labor symptoms and general obstetric precautions  including but not limited to vaginal bleeding, contractions, leaking of fluid and fetal movement were reviewed in detail with the patient. Please refer to After Visit Summary for other counseling recommendations.  Return in about 4 days (around 04/16/2016) for NST and AFI.  Schedule IOL on 04/18/16. Postpartum visit in 6 weeks.Tereso Newcomer.   [name], MD

## 2016-04-12 NOTE — Patient Instructions (Signed)
Return to clinic for any scheduled appointments or obstetric concerns, or go to [facility] for evaluation  

## 2016-04-16 ENCOUNTER — Ambulatory Visit (INDEPENDENT_AMBULATORY_CARE_PROVIDER_SITE_OTHER): Payer: Medicaid Other | Admitting: *Deleted

## 2016-04-16 VITALS — BP 118/74 | HR 86

## 2016-04-16 DIAGNOSIS — Z36 Encounter for antenatal screening of mother: Secondary | ICD-10-CM | POA: Diagnosis not present

## 2016-04-16 DIAGNOSIS — O480 Post-term pregnancy: Secondary | ICD-10-CM

## 2016-04-16 NOTE — Progress Notes (Signed)
IOL scheduled 8/22 @ 0630 - approved per Dr. Adrian BlackwaterStinson.

## 2016-04-16 NOTE — Progress Notes (Signed)
NST reactive.

## 2016-04-17 ENCOUNTER — Inpatient Hospital Stay (HOSPITAL_COMMUNITY): Payer: Medicaid Other | Admitting: Anesthesiology

## 2016-04-17 ENCOUNTER — Inpatient Hospital Stay (HOSPITAL_COMMUNITY)
Admission: RE | Admit: 2016-04-17 | Discharge: 2016-04-18 | DRG: 775 | Disposition: A | Discharge status: Home / Self Care | Payer: Medicaid Other | Source: Ambulatory Visit | Attending: Family Medicine | Admitting: Family Medicine

## 2016-04-17 ENCOUNTER — Encounter (HOSPITAL_COMMUNITY): Payer: Self-pay

## 2016-04-17 VITALS — BP 111/16 | HR 67 | Temp 98.1°F | Resp 18 | Ht 68.0 in | Wt 150.0 lb

## 2016-04-17 DIAGNOSIS — Z823 Family history of stroke: Secondary | ICD-10-CM | POA: Diagnosis not present

## 2016-04-17 DIAGNOSIS — O480 Post-term pregnancy: Principal | ICD-10-CM | POA: Diagnosis present

## 2016-04-17 DIAGNOSIS — Z8249 Family history of ischemic heart disease and other diseases of the circulatory system: Secondary | ICD-10-CM

## 2016-04-17 DIAGNOSIS — O99824 Streptococcus B carrier state complicating childbirth: Secondary | ICD-10-CM | POA: Diagnosis present

## 2016-04-17 DIAGNOSIS — Z3A40 40 weeks gestation of pregnancy: Secondary | ICD-10-CM

## 2016-04-17 DIAGNOSIS — O2341 Unspecified infection of urinary tract in pregnancy, first trimester: Secondary | ICD-10-CM

## 2016-04-17 DIAGNOSIS — O99820 Streptococcus B carrier state complicating pregnancy: Secondary | ICD-10-CM

## 2016-04-17 LAB — CBC
HEMATOCRIT: 31.7 % — AB (ref 36.0–46.0)
HEMOGLOBIN: 10.9 g/dL — AB (ref 12.0–15.0)
MCH: 28.2 pg (ref 26.0–34.0)
MCHC: 34.4 g/dL (ref 30.0–36.0)
MCV: 81.9 fL (ref 78.0–100.0)
Platelets: 244 10*3/uL (ref 150–400)
RBC: 3.87 MIL/uL (ref 3.87–5.11)
RDW: 14.5 % (ref 11.5–15.5)
WBC: 7.2 10*3/uL (ref 4.0–10.5)

## 2016-04-17 LAB — TYPE AND SCREEN
ABO/RH(D): O POS
Antibody Screen: NEGATIVE

## 2016-04-17 LAB — RPR: RPR Ser Ql: NONREACTIVE

## 2016-04-17 LAB — ABO/RH: ABO/RH(D): O POS

## 2016-04-17 MED ORDER — LACTATED RINGERS IV SOLN
INTRAVENOUS | Status: DC
  Administered 2016-04-17 (×2): via INTRAVENOUS

## 2016-04-17 MED ORDER — FENTANYL CITRATE (PF) 100 MCG/2ML IJ SOLN
50.0000 ug | INTRAMUSCULAR | Status: DC | PRN
  Administered 2016-04-17: 50 ug via INTRAVENOUS
  Filled 2016-04-17: qty 2

## 2016-04-17 MED ORDER — ACETAMINOPHEN 325 MG PO TABS: 650.0000 mg | ORAL_TABLET | ORAL | Status: DC | PRN

## 2016-04-17 MED ORDER — TETANUS-DIPHTH-ACELL PERTUSSIS 5-2.5-18.5 LF-MCG/0.5 IM SUSP: 0.5000 mL | Freq: Once | INTRAMUSCULAR | Status: DC

## 2016-04-17 MED ORDER — LACTATED RINGERS IV SOLN: 500.0000 mL | INTRAVENOUS | Status: DC | PRN

## 2016-04-17 MED ORDER — LACTATED RINGERS IV SOLN
500.0000 mL | Freq: Once | INTRAVENOUS | Status: AC
  Administered 2016-04-17: 500 mL via INTRAVENOUS

## 2016-04-17 MED ORDER — DIPHENHYDRAMINE HCL 50 MG/ML IJ SOLN: 12.5000 mg | INTRAMUSCULAR | Status: DC | PRN

## 2016-04-17 MED ORDER — ACETAMINOPHEN 325 MG PO TABS
650.0000 mg | ORAL_TABLET | ORAL | Status: DC | PRN
  Administered 2016-04-17 – 2016-04-18 (×5): 650 mg via ORAL
  Filled 2016-04-17 (×5): qty 2

## 2016-04-17 MED ORDER — DIPHENHYDRAMINE HCL 25 MG PO CAPS: 25.0000 mg | ORAL_CAPSULE | Freq: Four times a day (QID) | ORAL | Status: DC | PRN

## 2016-04-17 MED ORDER — SIMETHICONE 80 MG PO CHEW: 80.0000 mg | CHEWABLE_TABLET | ORAL | Status: DC | PRN

## 2016-04-17 MED ORDER — LIDOCAINE HCL (PF) 1 % IJ SOLN
INTRAMUSCULAR | Status: DC | PRN
  Administered 2016-04-17: 30 mL
  Administered 2016-04-17 (×2): 4 mL

## 2016-04-17 MED ORDER — PHENYLEPHRINE 40 MCG/ML (10ML) SYRINGE FOR IV PUSH (FOR BLOOD PRESSURE SUPPORT)
80.0000 ug | PREFILLED_SYRINGE | INTRAVENOUS | Status: DC | PRN
  Filled 2016-04-17: qty 5

## 2016-04-17 MED ORDER — LIDOCAINE HCL (PF) 1 % IJ SOLN
30.0000 mL | INTRAMUSCULAR | Status: DC | PRN
  Filled 2016-04-17: qty 30

## 2016-04-17 MED ORDER — MISOPROSTOL 25 MCG QUARTER TABLET
25.0000 ug | ORAL_TABLET | ORAL | Status: DC
  Administered 2016-04-17: 25 ug via VAGINAL
  Filled 2016-04-17 (×4): qty 1
  Filled 2016-04-17: qty 0.25
  Filled 2016-04-17 (×2): qty 1

## 2016-04-17 MED ORDER — SENNOSIDES-DOCUSATE SODIUM 8.6-50 MG PO TABS
2.0000 | ORAL_TABLET | ORAL | Status: DC
  Administered 2016-04-18: 2 via ORAL
  Filled 2016-04-17: qty 2

## 2016-04-17 MED ORDER — EPHEDRINE 5 MG/ML INJ
10.0000 mg | INTRAVENOUS | Status: DC | PRN
  Filled 2016-04-17: qty 4

## 2016-04-17 MED ORDER — ONDANSETRON HCL 4 MG/2ML IJ SOLN: 4.0000 mg | Freq: Four times a day (QID) | INTRAMUSCULAR | Status: DC | PRN

## 2016-04-17 MED ORDER — OXYCODONE-ACETAMINOPHEN 5-325 MG PO TABS: 2.0000 | ORAL_TABLET | ORAL | Status: DC | PRN

## 2016-04-17 MED ORDER — WITCH HAZEL-GLYCERIN EX PADS: 1.0000 "application " | MEDICATED_PAD | CUTANEOUS | Status: DC | PRN

## 2016-04-17 MED ORDER — FLEET ENEMA 7-19 GM/118ML RE ENEM: 1.0000 | ENEMA | RECTAL | Status: DC | PRN

## 2016-04-17 MED ORDER — DIBUCAINE 1 % RE OINT: 1.0000 "application " | TOPICAL_OINTMENT | RECTAL | Status: DC | PRN

## 2016-04-17 MED ORDER — OXYTOCIN BOLUS FROM INFUSION
500.0000 mL | Freq: Once | INTRAVENOUS | Status: AC
  Administered 2016-04-17: 500 mL via INTRAVENOUS

## 2016-04-17 MED ORDER — PENICILLIN G POTASSIUM 5000000 UNITS IJ SOLR
2.5000 10*6.[IU] | INTRAVENOUS | Status: DC
  Administered 2016-04-17: 2.5 10*6.[IU] via INTRAVENOUS
  Filled 2016-04-17 (×4): qty 2.5

## 2016-04-17 MED ORDER — PHENYLEPHRINE 40 MCG/ML (10ML) SYRINGE FOR IV PUSH (FOR BLOOD PRESSURE SUPPORT)
80.0000 ug | PREFILLED_SYRINGE | INTRAVENOUS | Status: DC | PRN
  Filled 2016-04-17: qty 10
  Filled 2016-04-17: qty 5

## 2016-04-17 MED ORDER — PENICILLIN G POTASSIUM 5000000 UNITS IJ SOLR
5.0000 10*6.[IU] | Freq: Once | INTRAVENOUS | Status: AC
  Administered 2016-04-17: 5 10*6.[IU] via INTRAVENOUS
  Filled 2016-04-17: qty 5

## 2016-04-17 MED ORDER — OXYTOCIN 40 UNITS IN LACTATED RINGERS INFUSION - SIMPLE MED
2.5000 [IU]/h | INTRAVENOUS | Status: DC
  Filled 2016-04-17: qty 1000

## 2016-04-17 MED ORDER — ZOLPIDEM TARTRATE 5 MG PO TABS: 5.0000 mg | ORAL_TABLET | Freq: Every evening | ORAL | Status: DC | PRN

## 2016-04-17 MED ORDER — BENZOCAINE-MENTHOL 20-0.5 % EX AERO
1.0000 "application " | INHALATION_SPRAY | CUTANEOUS | Status: DC | PRN
  Administered 2016-04-17: 1 via TOPICAL
  Filled 2016-04-17: qty 56

## 2016-04-17 MED ORDER — FENTANYL 2.5 MCG/ML BUPIVACAINE 1/10 % EPIDURAL INFUSION (WH - ANES)
14.0000 mL/h | INTRAMUSCULAR | Status: DC | PRN
  Administered 2016-04-17: 14 mL/h via EPIDURAL
  Filled 2016-04-17: qty 125

## 2016-04-17 MED ORDER — TERBUTALINE SULFATE 1 MG/ML IJ SOLN
0.2500 mg | Freq: Once | INTRAMUSCULAR | Status: DC | PRN
  Filled 2016-04-17: qty 1

## 2016-04-17 MED ORDER — EPHEDRINE 5 MG/ML INJ
10.0000 mg | INTRAVENOUS | Status: DC | PRN
Start: 2016-04-17 — End: 2016-04-17
  Filled 2016-04-17: qty 4

## 2016-04-17 MED ORDER — OXYCODONE HCL 5 MG PO TABS
5.0000 mg | ORAL_TABLET | Freq: Once | ORAL | Status: AC
  Administered 2016-04-17: 5 mg via ORAL
  Filled 2016-04-17: qty 1

## 2016-04-17 MED ORDER — ONDANSETRON HCL 4 MG/2ML IJ SOLN: 4.0000 mg | INTRAMUSCULAR | Status: DC | PRN

## 2016-04-17 MED ORDER — OXYCODONE-ACETAMINOPHEN 5-325 MG PO TABS: 1.0000 | ORAL_TABLET | ORAL | Status: DC | PRN

## 2016-04-17 MED ORDER — SOD CITRATE-CITRIC ACID 500-334 MG/5ML PO SOLN: 30.0000 mL | ORAL | Status: DC | PRN

## 2016-04-17 MED ORDER — COCONUT OIL OIL: 1.0000 "application " | TOPICAL_OIL | Status: DC | PRN

## 2016-04-17 MED ORDER — ONDANSETRON HCL 4 MG PO TABS: 4.0000 mg | ORAL_TABLET | ORAL | Status: DC | PRN

## 2016-04-17 MED ORDER — PRENATAL MULTIVITAMIN CH
1.0000 | ORAL_TABLET | Freq: Every day | ORAL | Status: DC
  Filled 2016-04-17: qty 1

## 2016-04-17 NOTE — Anesthesia Preprocedure Evaluation (Signed)
Anesthesia Evaluation  Patient identified by MRN, date of birth, ID band Patient awake    Reviewed: Allergy & Precautions, NPO status , Patient's Chart, lab work & pertinent test results  History of Anesthesia Complications Negative for: history of anesthetic complications  Airway Mallampati: II  TM Distance: >3 FB Neck ROM: Full    Dental no notable dental hx. (+) Dental Advisory Given   Pulmonary neg pulmonary ROS,    Pulmonary exam normal breath sounds clear to auscultation       Cardiovascular negative cardio ROS Normal cardiovascular exam Rhythm:Regular Rate:Normal     Neuro/Psych negative neurological ROS  negative psych ROS   GI/Hepatic negative GI ROS, Neg liver ROS,   Endo/Other  negative endocrine ROS  Renal/GU negative Renal ROS  negative genitourinary   Musculoskeletal negative musculoskeletal ROS (+)   Abdominal   Peds negative pediatric ROS (+)  Hematology negative hematology ROS (+)   Anesthesia Other Findings   Reproductive/Obstetrics (+) Pregnancy                             Anesthesia Physical Anesthesia Plan  ASA: II  Anesthesia Plan: Epidural   Post-op Pain Management:    Induction:   Airway Management Planned:   Additional Equipment:   Intra-op Plan:   Post-operative Plan:   Informed Consent: I have reviewed the patients History and Physical, chart, labs and discussed the procedure including the risks, benefits and alternatives for the proposed anesthesia with the patient or authorized representative who has indicated his/her understanding and acceptance.   Dental advisory given  Plan Discussed with: CRNA  Anesthesia Plan Comments:         Anesthesia Quick Evaluation  

## 2016-04-17 NOTE — Anesthesia Postprocedure Evaluation (Signed)
Anesthesia Post Note  Patient: Misty Hall  Procedure(s) Performed: * No procedures listed *  Patient location during evaluation: Mother Baby Anesthesia Type: Epidural Level of consciousness: awake and alert Pain management: satisfactory to patient Vital Signs Assessment: post-procedure vital signs reviewed and stable Respiratory status: respiratory function stable Cardiovascular status: stable Postop Assessment: no headache, no backache, epidural receding, patient able to bend at knees, no signs of nausea or vomiting and adequate PO intake Anesthetic complications: no     Last Vitals:  Vitals:   04/17/16 1720 04/17/16 1830  BP: 116/65 113/68  Pulse: 76 74  Resp: 18 18  Temp: 36.8 C     Last Pain:  Vitals:   04/17/16 1800  TempSrc:   PainSc: 6    Pain Goal: Patients Stated Pain Goal: 6 (04/17/16 0826)               Karleen DolphinFUSSELL,[name]

## 2016-04-17 NOTE — Lactation Note (Signed)
This note was copied from a baby's chart. Lactation Consultation Note  Patient Name: Misty Hall ZOXWR'UToday's Date: 04/17/2016 Reason for consult: Initial assessment Baby at 7 hr of life and mom has decided to stop bf. She stated she might try again when she gets home. Left lactation handouts. She is aware of lactation services and support group. She will call as needed.   Maternal Data    Feeding    LATCH Score/Interventions                      Lactation Tools Discussed/Used     Consult Status Consult Status: Complete    Rulon Eisenmengerlizabeth E Nasia Cannan 04/17/2016, 10:50 PM

## 2016-04-17 NOTE — H&P (Signed)
LABOR AND DELIVERY ADMISSION HISTORY AND PHYSICAL NOTE  Misty Hall is a 30 y.o. female [phone] [id number] with IUP at 7862w6d by LMP presenting for IOL 2/2 postdates.   She reports positive fetal movement. She denies leakage of fluid or vaginal bleeding. Endorses irregular mild contractions  Prenatal History/Complications: none  Past Medical History: Past Medical History:  Diagnosis Date  . Hemoglobin A-S genotype [facility](HCC)     Past Surgical History: History reviewed. No pertinent surgical history.  Obstetrical History: OB History    Gravida Para Term Preterm AB Living   4 2 2   1 2    SAB TAB Ectopic Multiple Live Births   1       2      Social History: Social History   Social History  . Marital status: Single    Spouse name: N/A  . Number of children: N/A  . Years of education: N/A   Social History Main Topics  . Smoking status: Never Smoker  . Smokeless tobacco: Never Used  . Alcohol use No  . Drug use: No  . Sexual activity: Yes    Birth control/ protection: None   Other Topics Concern  . None   Social History Narrative  . None    Family History: Family History  Problem Relation Age of Onset  . Stroke Mother   . Heart disease Father     Allergies: Allergies  Allergen Reactions  . Aleve [Naproxen Sodium] Hives    No prescriptions prior to admission.     Review of Systems   All systems reviewed and negative except as stated in HPI  Blood pressure 100/67, pulse 83, temperature 97.9 F (36.6 C), resp. rate 16, height 5\' 8"  (1.727 m), weight 68 kg (150 lb), last menstrual period 07/06/2015, unknown if currently breastfeeding. General appearance: alert, cooperative and no distress Lungs: no respiratory distress Heart: regular rate Abdomen: soft, non-tender Extremities: No calf swelling or tenderness Presentation: cephalic by cervical exam Fetal monitoring: 125, moderate variability, +accelerations, no decelerations  Uterine activity: irregular     Prenatal labs: ABO, Rh: O/POS/-- (02/23 1438) Antibody: NEG (02/23 1438) Rubella: immune RPR: NON REAC (05/30 0001)  HBsAg: NEGATIVE (02/23 1438)  HIV: NONREACTIVE (05/30 0001)  GBS:   positive Genetic screening:  declined Anatomy US: normal  Prenatal Transfer Tool  Maternal Diabetes: No Genetic Screening: Declined Maternal Ultrasounds/Referrals: Normal Fetal Ultrasounds or other Referrals:  None Maternal Substance Abuse:  No Significant Maternal Medications:  None Significant Maternal Lab Results: Lab values include: Group B Strep positive  Results for orders placed or performed during the hospital encounter of 04/17/16 (from the past 24 hour(s))  CBC   Collection Time: 04/17/16  8:38 AM  Result Value Ref Range   WBC 7.2 4.0 - 10.5 K/uL   RBC 3.87 3.87 - 5.11 MIL/uL   Hemoglobin 10.9 (L) 12.0 - 15.0 g/dL   HCT 45.431.7 (L) 09.836.0 - 11.946.0 %   MCV 81.9 78.0 - 100.0 fL   MCH 28.2 26.0 - 34.0 pg   MCHC 34.4 30.0 - 36.0 g/dL   RDW 14.714.5 82.911.5 - 56.215.5 %   Platelets 244 150 - 400 K/uL    Patient Active Problem List   Diagnosis Date Noted  . Post-dates pregnancy 04/17/2016  . Group B Streptococcus carrier, antepartum 03/15/2016  . Supervision of low-risk pregnancy 01/02/2016  . Hemoglobin A-S genotype (HCC)   . UTI (urinary tract infection) in pregnancy in first trimester 10/24/2015    Assessment: Misty AnesSamantha Hall  is a 30 y.o. [id number] at 286w6d here for IOL 2/2 postdates  #Labor:vaginal cytotec pt refused foley bulb #Pain: Planning on epidural #FWB: Category I #ID:  GBS pos #MOF: bottle #MOC:depo #Circ:  Girl. N/A  Leland HerElsia J Yoo, DO PGY-1 8/22/20179:55 AM   OB FELLOW HISTORY AND PHYSICAL ATTESTATION  I have seen and examined this patient; I agree with above documentation in the resident's note.    Ernestina Pennaicholas Schenk 04/17/2016, 10:05 AM

## 2016-04-17 NOTE — Anesthesia Pain Management Evaluation Note (Signed)
  CRNA Pain Management Visit Note  Patient: Misty Hall, 30 y.o., female  "Hello I am a member of the anesthesia [facility]. We have an anesthesia team available at all times to provide care throughout the hospital, including epidural management and anesthesia for C-section. I don't know your plan for the delivery whether it a natural birth, water birth, IV sedation, nitrous supplementation, doula or epidural, but we want to meet your pain goals."   1.Was your pain managed to your expectations on prior hospitalizations?   Yes   2.What is your expectation for pain management during this hospitalization?     Epidural  3.How can we help you reach that goal? Epidural  Record the patient's initial score and the patient's pain goal.   Pain: 5  Pain Goal: 7 The [facility] wants you to be able to say your pain was always managed very well.  Halynn Reitano 04/17/2016

## 2016-04-17 NOTE — Anesthesia Procedure Notes (Signed)
Epidural Patient location during procedure: OB  Staffing Anesthesiologist: Beckhem Isadore Performed: anesthesiologist   Preanesthetic Checklist Completed: patient identified, site marked, surgical consent, pre-op evaluation, timeout performed, IV checked, risks and benefits discussed and monitors and equipment checked  Epidural Patient position: sitting Prep: site prepped and draped and DuraPrep Patient monitoring: continuous pulse ox and blood pressure Approach: midline Location: L3-L4 Injection technique: LOR saline  Needle:  Needle type: Tuohy  Needle gauge: 17 G Needle length: 9 cm and 9 Needle insertion depth: 5 cm cm Catheter type: closed end flexible Catheter size: 19 Gauge Catheter at skin depth: 10 cm Test dose: negative  Assessment Events: blood not aspirated, injection not painful, no injection resistance, negative IV test and no paresthesia  Additional Notes Patient identified. Risks/Benefits/Options discussed with patient including but not limited to bleeding, infection, nerve damage, paralysis, failed block, incomplete pain control, headache, blood pressure changes, nausea, vomiting, reactions to medication both or allergic, itching and postpartum back pain. Confirmed with bedside nurse the patient's most recent platelet count. Confirmed with patient that they are not currently taking any anticoagulation, have any bleeding history or any family history of bleeding disorders. Patient expressed understanding and wished to proceed. All questions were answered. Sterile technique was used throughout the entire procedure. Please see nursing notes for vital signs. Test dose was given through epidural catheter and negative prior to continuing to dose epidural or start infusion. Warning signs of high block given to the patient including shortness of breath, tingling/numbness in hands, complete motor block, or any concerning symptoms with instructions to call for help. Patient was  given instructions on fall risk and not to get out of bed. All questions and concerns addressed with instructions to call with any issues or inadequate analgesia.        

## 2016-04-18 LAB — CBC
HCT: 32.7 % — ABNORMAL LOW (ref 36.0–46.0)
Hemoglobin: 11.2 g/dL — ABNORMAL LOW (ref 12.0–15.0)
MCH: 28.7 pg (ref 26.0–34.0)
MCHC: 34.3 g/dL (ref 30.0–36.0)
MCV: 83.8 fL (ref 78.0–100.0)
PLATELETS: 251 10*3/uL (ref 150–400)
RBC: 3.9 MIL/uL (ref 3.87–5.11)
RDW: 14.5 % (ref 11.5–15.5)
WBC: 9.7 10*3/uL (ref 4.0–10.5)

## 2016-04-18 MED ORDER — SENNOSIDES-DOCUSATE SODIUM 8.6-50 MG PO TABS: 2.0000 | ORAL_TABLET | ORAL | 0 refills | Status: DC

## 2016-04-18 MED ORDER — IBUPROFEN 600 MG PO TABS
600.0000 mg | ORAL_TABLET | Freq: Four times a day (QID) | ORAL | Status: DC
  Administered 2016-04-18 (×2): 600 mg via ORAL
  Filled 2016-04-18 (×2): qty 1

## 2016-04-18 MED ORDER — OXYCODONE-ACETAMINOPHEN 5-325 MG PO TABS: 1.0000 | ORAL_TABLET | ORAL | 0 refills | Status: DC | PRN

## 2016-04-18 MED ORDER — ACETAMINOPHEN 325 MG PO TABS: 650.0000 mg | ORAL_TABLET | ORAL | 1 refills | Status: DC | PRN

## 2016-04-18 NOTE — Progress Notes (Signed)
UR chart review completed.  

## 2016-04-18 NOTE — Discharge Instructions (Signed)

## 2016-04-18 NOTE — Plan of Care (Signed)
Problem: Nutritional: Goal: Mother's verbalization of comfort with breastfeeding process will improve Outcome: Not Progressing Lots of bottlefeeding since delivery

## 2016-04-18 NOTE — Discharge Summary (Signed)
OB Discharge Summary     Patient Name: Misty Hall DOB: 07/01/1986 MRN: [id number]  Date of admission: 04/17/2016 Delivering MD: Ernestina PennaSCHENK, Misty MICHAEL   Date of discharge: 04/18/2016  Admitting diagnosis: INDUCTION Intrauterine pregnancy: 487w6d     Secondary diagnosis:  Active Problems:   Post-dates pregnancy  Additional problems: None      Discharge diagnosis: Term Pregnancy Delivered                                                                                                Post partum procedures:none  Augmentation: Pitocin, Cytotec and Foley Balloon  Complications: None  Hospital course:  Induction of Labor With Vaginal Delivery   30 y.o. yo [phone] [id number] at 597w6d was admitted to the hospital 04/17/2016 for induction of labor.  Indication for induction: Postdates.  Patient had an uncomplicated labor course as follows: Membrane Rupture Time/Date: 2:12 PM ,04/17/2016   Intrapartum Procedures: Episiotomy: None [1]                                         Lacerations:  1st degree [2]  Patient had delivery of a Viable infant.  Information for the patient's newborn:  Misty Hall, Girl Misty Hall [[id number]][[id number]]  Delivery Method: Vaginal, Spontaneous Delivery (Filed from Delivery Summary)   04/17/2016  Details of delivery can be found in separate delivery note.  Patient had a routine postpartum course. Patient is discharged home 04/18/16.   Physical exam Vitals:   04/17/16 1631 04/17/16 1720 04/17/16 1830 04/18/16 0520  BP: 112/64 116/65 113/68 108/68  Pulse: 84 76 74 68  Resp: 18 18 18 18   Temp:  98.2 F (36.8 C)  98.1 F (36.7 C)  TempSrc:  Oral  Oral  SpO2:  100% 100%   Weight:      Height:       General: alert and cooperative Lochia: appropriate Uterine Fundus: firm Incision: N/A DVT Evaluation: No evidence of DVT seen on physical exam. Labs: Lab Results  Component Value Date   WBC 9.7 04/18/2016   HGB 11.2 (L) 04/18/2016   HCT 32.7 (L) 04/18/2016   MCV 83.8  04/18/2016   PLT 251 04/18/2016   CMP Latest Ref Rng & Units 01/27/2015  Glucose 65 - 99 mg/dL 98  BUN 6 - 20 mg/dL <2(G<5(L)  Creatinine 4.010.44 - 1.00 mg/dL 0.270.69  Sodium 253135 - 664145 mmol/L 138  Potassium 3.5 - 5.1 mmol/L 3.6  Chloride 101 - 111 mmol/L 101  CO2 22 - 32 mmol/L 25  Calcium 8.9 - 10.3 mg/dL 40.310.0  Total Protein 6.5 - 8.1 g/dL 7.1  Total Bilirubin 0.3 - 1.2 mg/dL 0.4  Alkaline Phos 38 - 126 U/L 24(L)  AST 15 - 41 U/L 52(H)  ALT 14 - 54 U/L 57(H)    Discharge instruction: per After Visit Summary and "Baby and Me Booklet".  After visit meds:    Medication List    TAKE these medications   acetaminophen 325 MG tablet Commonly known as:  TYLENOL  Take 2 tablets (650 mg total) by mouth every 4 (four) hours as needed (for pain scale < 4).   senna-docusate 8.6-50 MG tablet Commonly known as:  Senokot-S Take 2 tablets by mouth daily.       Diet: routine diet  Activity: Advance as tolerated. Pelvic rest for 6 weeks.   Outpatient follow up:6 weeks Follow up Appt:Future Appointments Date Time Provider Department Center  05/30/2016 1:20 PM Misty StaggersMichael L Ervin, MD [facility]   Follow up Visit:No Follow-up on file.  Postpartum contraception: Depo Provera  Newborn Data: Live born female  Birth Weight: 7 lb 0.4 oz (3185 g) APGAR: 9, 9  Baby Feeding: Bottle Disposition:home with mother   04/18/2016 Misty Angelene GiovanniZ Mikell, MD  OB FELLOW DISCHARGE ATTESTATION  I have seen and examined this patient and agree with above documentation in the resident's note.   Misty MowElizabeth Cayleb Jarnigan, DO OB Fellow 9:34 AM

## 2016-05-30 ENCOUNTER — Ambulatory Visit (INDEPENDENT_AMBULATORY_CARE_PROVIDER_SITE_OTHER): Payer: Medicaid Other | Admitting: Obstetrics and Gynecology

## 2016-05-30 ENCOUNTER — Encounter: Payer: Self-pay | Admitting: Obstetrics and Gynecology

## 2016-05-30 DIAGNOSIS — Z3042 Encounter for surveillance of injectable contraceptive: Secondary | ICD-10-CM

## 2016-05-30 DIAGNOSIS — Z3202 Encounter for pregnancy test, result negative: Secondary | ICD-10-CM | POA: Diagnosis not present

## 2016-05-30 LAB — POCT PREGNANCY, URINE
PREG TEST UR: NEGATIVE
Preg Test, Ur: NEGATIVE

## 2016-05-30 MED ORDER — MEDROXYPROGESTERONE ACETATE 150 MG/ML IM SUSP
150.0000 mg | Freq: Once | INTRAMUSCULAR | Status: AC
  Administered 2016-05-30: 150 mg via INTRAMUSCULAR

## 2016-05-30 NOTE — Progress Notes (Signed)
Subjective:     Misty Hall is a 30 y.o. female who presents for a postpartum visit. She is 6 weeks postpartum following a spontaneous vaginal delivery. I have fully reviewed the prenatal and intrapartum course. The delivery was at 40.6 gestational weeks. Outcome: spontaneous vaginal delivery. Anesthesia: epidural. Postpartum course has been unremarkable. Baby's course has been unremarkable. Baby is feeding by bottle - Similac Advance. Bleeding no bleeding. Bowel function is normal. Bladder function is normal. Patient is not sexually active. Contraception method is none. Patient is interested in Depo Provera Injections. Depression/Anxiety screening: negative.  The following portions of the patient's history were reviewed and updated as appropriate: allergies, current medications, past family history, past medical history, past social history and past surgical history.  Review of Systems Pertinent items are noted in HPI.   Objective:    There were no vitals taken for this visit.  General:  alert   Breasts:  deferred  Lungs: clear to auscultation bilaterally  Heart:  regular rate and rhythm, S1, S2 normal, no murmur, click, rub or gallop  Abdomen: soft, non-tender; bowel sounds normal; no masses,  no organomegaly   Vulva:  not evaluated  Vagina: not evaluated  Cervix:  not evaluated  Corpus: not examined  Adnexa:  not evaluated  Rectal Exam: Not performed.        Assessment:     Normal postpartum exam.   Plan:    1. Contraception: Depo-Provera injections 2. Follow up in: 1 year or as needed.

## 2016-05-30 NOTE — Patient Instructions (Signed)

## 2016-05-30 NOTE — Addendum Note (Signed)
Addended by: Sherre LainASH, Jarvis Sawa A on: 05/30/2016 02:16 PM   Modules accepted: Orders

## 2016-06-01 IMAGING — US US OB COMP LESS 14 WK
1 series · 13 of 28 positions shown · non-contrast
Comparison: None.

CLINICAL DATA: Vaginal bleeding.

EXAM:
OBSTETRIC <14 WK US AND TRANSVAGINAL OB US
TECHNIQUE: Both transabdominal and transvaginal ultrasound examinations were
performed for complete evaluation of the gestation as well as the
maternal uterus, adnexal regions, and pelvic cul-de-sac.
Transvaginal technique was performed to assess early pregnancy.

[Series 1: us ob comp less 14 wk · 0.18mm/px · 13 of 41 slices shown]
[im 2/41]
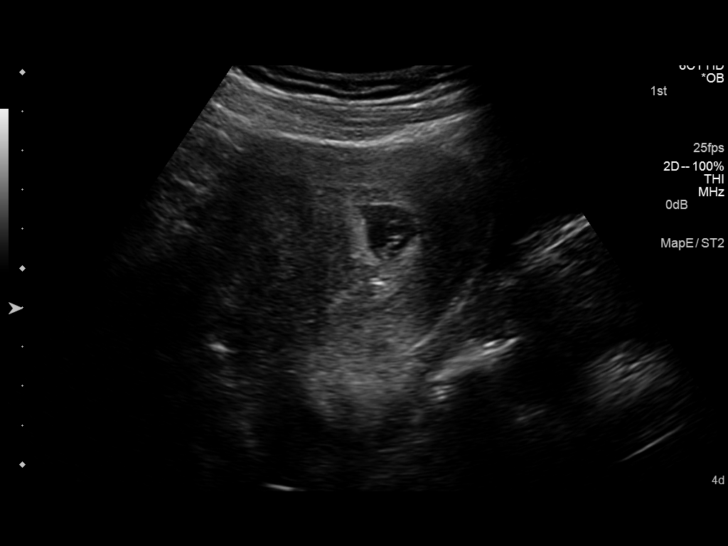
[im 5/41]
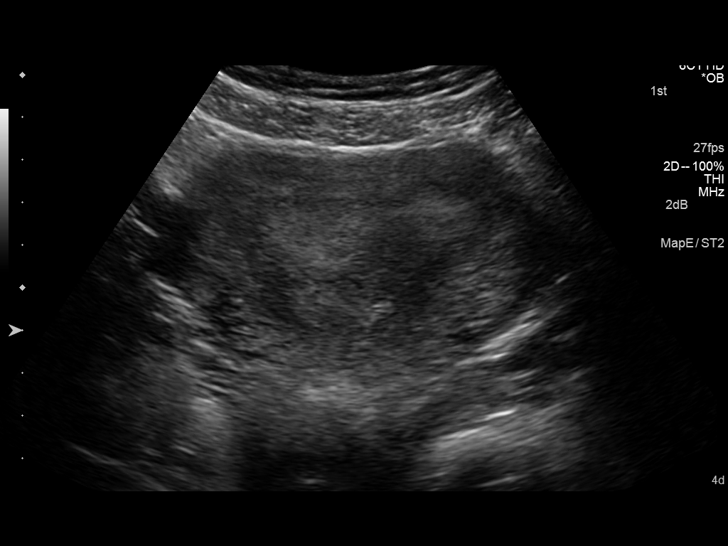
[im 8/41]
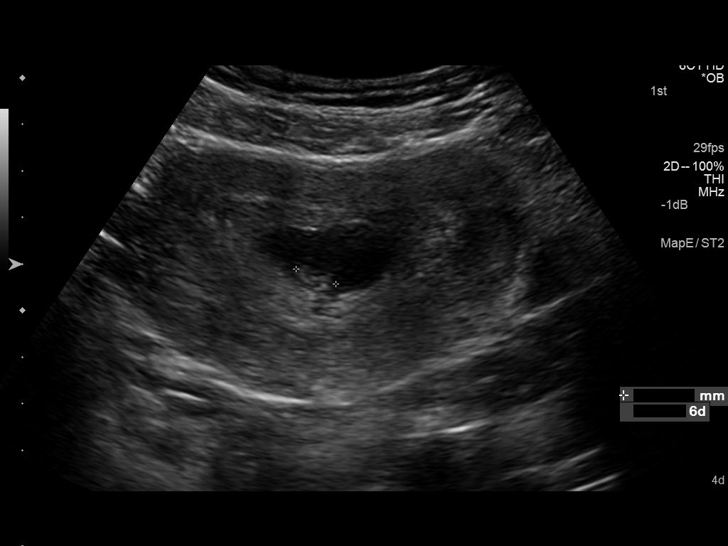
[im 11/41]
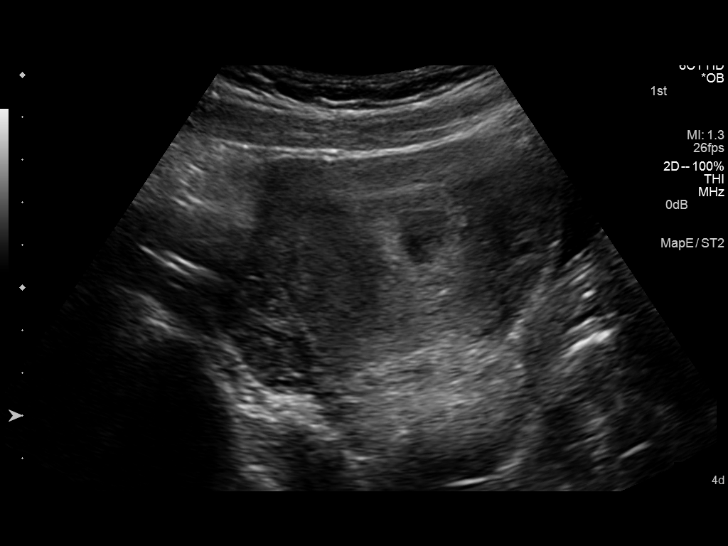
[im 14/41]
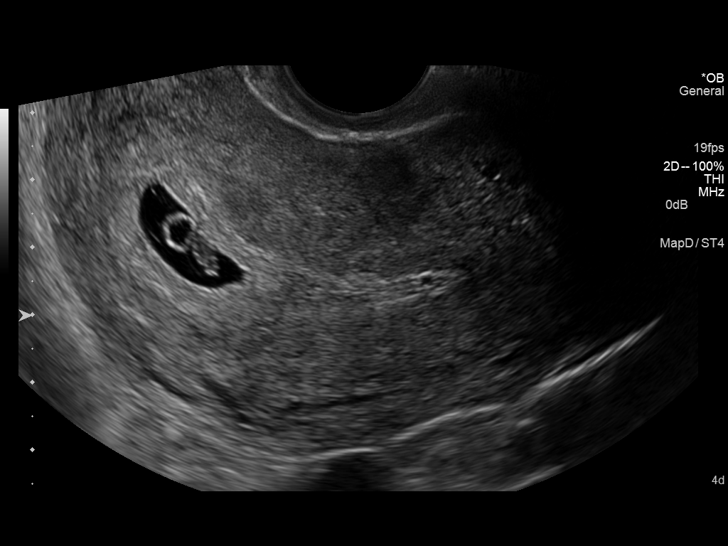
[im 17/41]
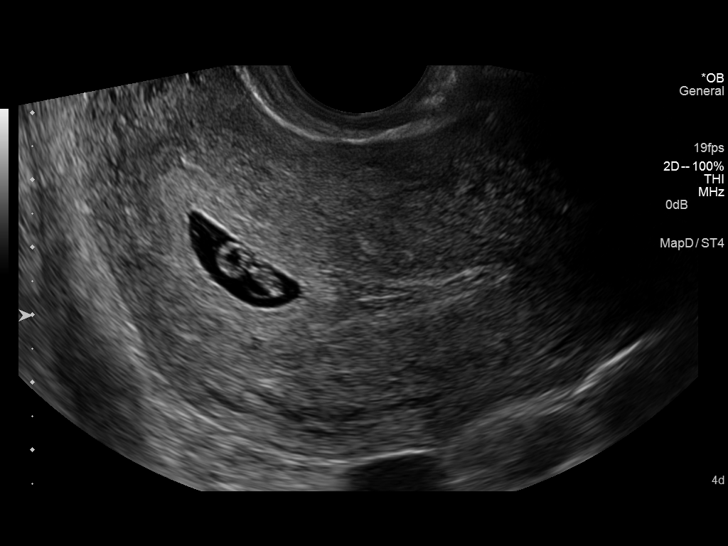
[im 21/41]
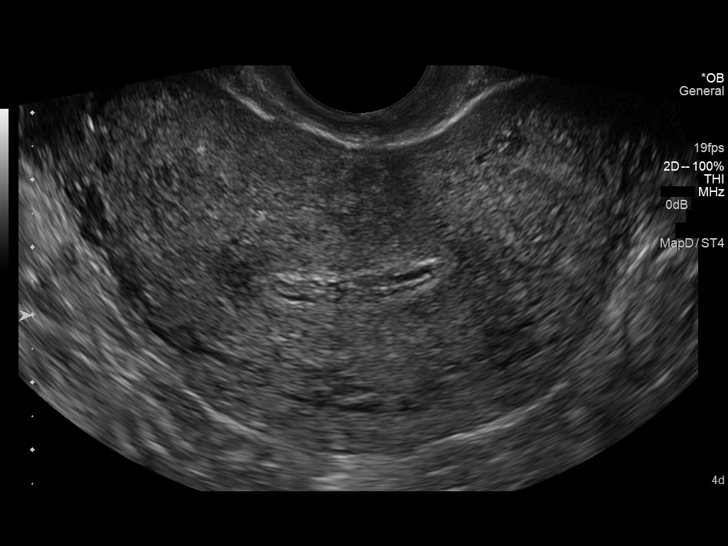
[im 24/41]
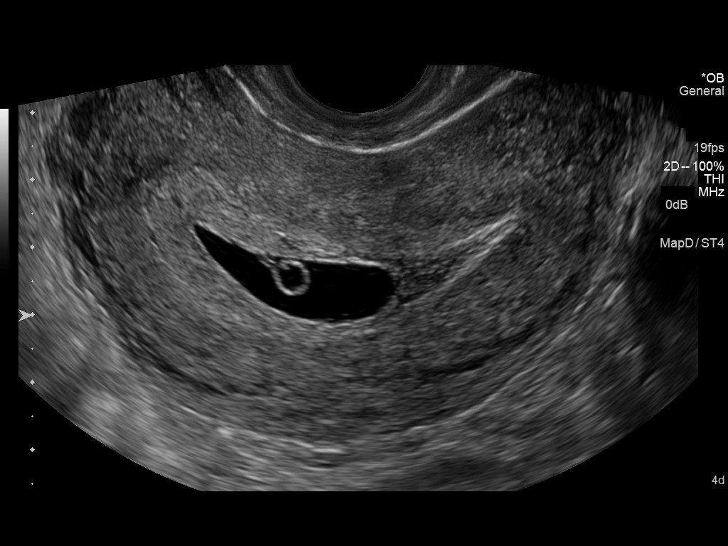
[im 27/41]
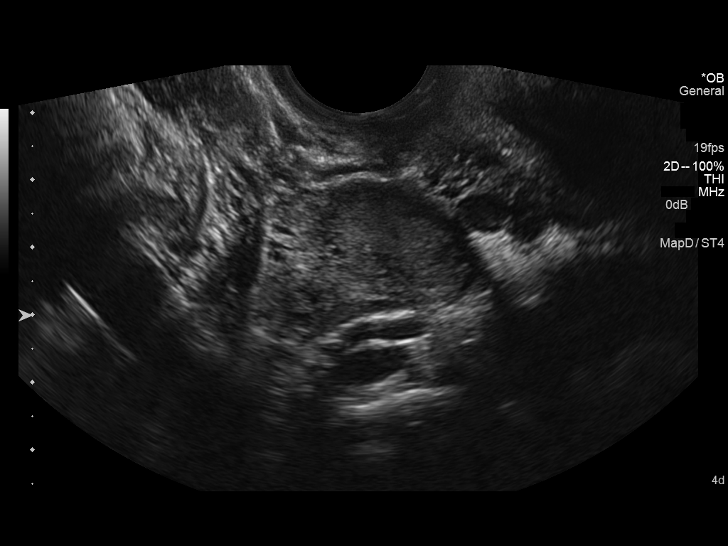
[im 30/41]
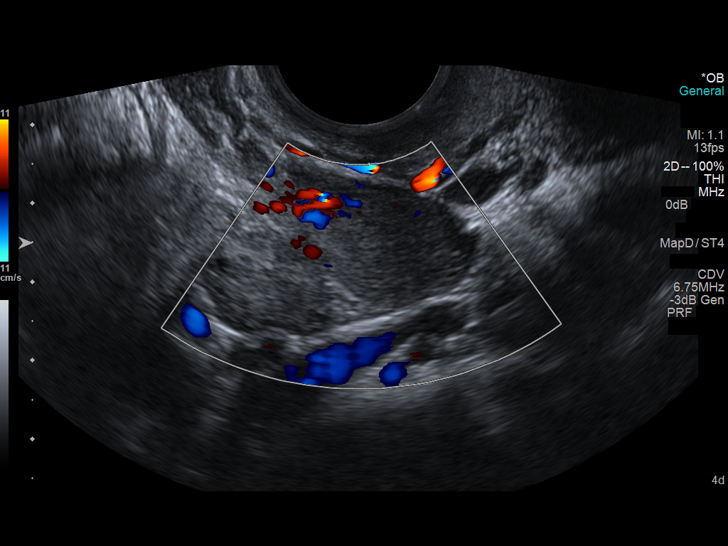
[im 33/41]
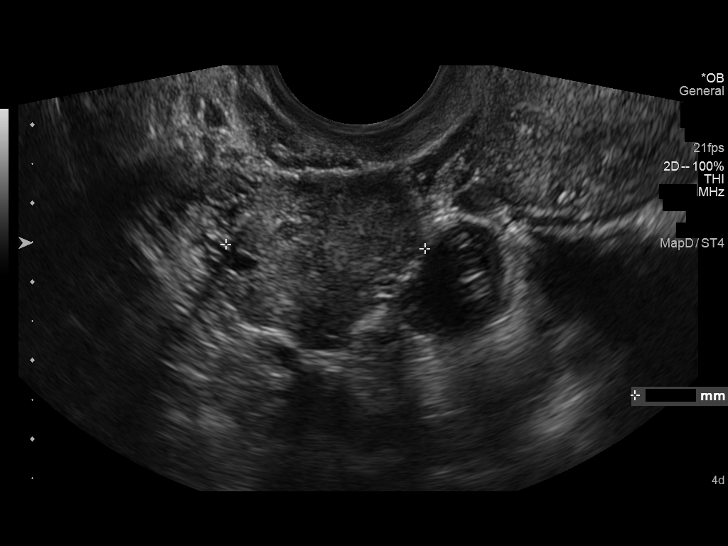
[im 36/41]
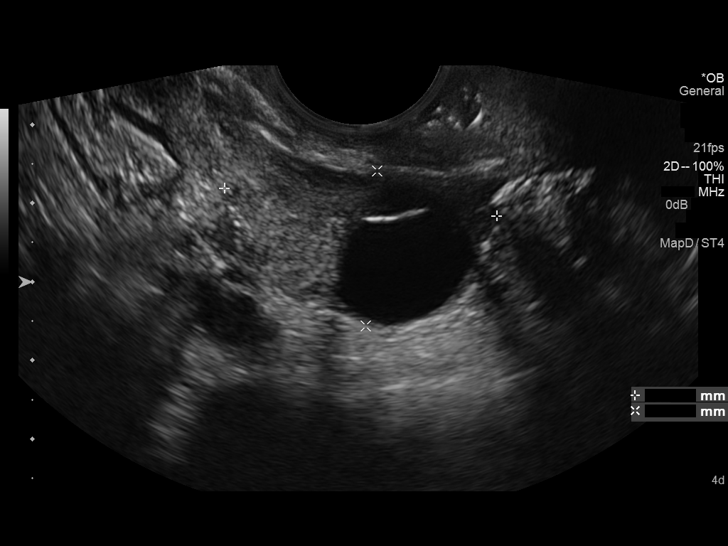
[im 39/41]
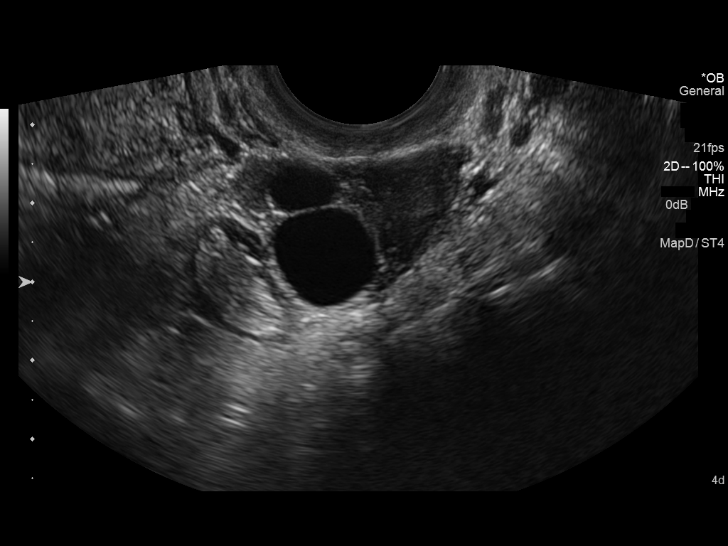

[13 of 28 positions shown; findings below may reference images not displayed]

FINDINGS: Intrauterine gestational sac: Single normal-appearing intrauterine
gestational sac.

Yolk sac:  Visualized

Embryo:  Visualized

Cardiac Activity: Visualized

Heart Rate: 131  bpm

CRL:  8.7  mm   6 w   6 d                  US EDC: 09/07/2015

Maternal uterus/adnexae:

Normal appearance of the anteverted uterus. No discrete uterine
mass.

Normal size and appearance of the right ovary, measuring
approximately 3.4 x 2.3 x 2.5 cm. No discrete right-sided ovarian or
adnexal lesions.

Normal size and appearance of the left ovary measuring approximately
3.5 x 2.0 x 2.9 cm. Note is made of 2 adjacent anechoic left-sided
corpus luteal cysts with dominant cyst measuring approximately 1.9 x
1.3 x 1.3 cm.

No free fluid within the pelvic cul-de-sac.
IMPRESSION: Single viable intrauterine gestation with crown rump length
compatible with 6 weeks, 6 days gestation and estimated delivery
date of 09/07/2015

## 2016-08-15 ENCOUNTER — Ambulatory Visit (INDEPENDENT_AMBULATORY_CARE_PROVIDER_SITE_OTHER): Payer: Medicaid Other | Admitting: *Deleted

## 2016-08-15 VITALS — BP 122/81 | HR 85 | Wt 147.0 lb

## 2016-08-15 DIAGNOSIS — Z3042 Encounter for surveillance of injectable contraceptive: Secondary | ICD-10-CM

## 2016-08-15 MED ORDER — MEDROXYPROGESTERONE ACETATE 150 MG/ML IM SUSP
150.0000 mg | Freq: Once | INTRAMUSCULAR | Status: AC
  Administered 2016-08-15: 150 mg via INTRAMUSCULAR

## 2016-08-15 NOTE — Progress Notes (Signed)
Depo provera 150 mg given IM as scheduled.  Next injection due 3/7-3/21/18.

## 2016-10-31 ENCOUNTER — Ambulatory Visit (INDEPENDENT_AMBULATORY_CARE_PROVIDER_SITE_OTHER): Payer: Medicaid Other | Admitting: General Practice

## 2016-10-31 VITALS — BP 109/78 | HR 86 | Ht 69.0 in | Wt 148.6 lb

## 2016-10-31 DIAGNOSIS — Z3042 Encounter for surveillance of injectable contraceptive: Secondary | ICD-10-CM

## 2016-10-31 MED ORDER — MEDROXYPROGESTERONE ACETATE 150 MG/ML IM SUSP
150.0000 mg | Freq: Once | INTRAMUSCULAR | Status: AC
  Administered 2016-10-31: 150 mg via INTRAMUSCULAR

## 2016-10-31 NOTE — Progress Notes (Signed)
Patient here for depo today. Patient reports ongoing pelvic pain and would like to see provider. Told patient she will have to make a separate appt for that. Patient verbalized understanding

## 2017-01-30 ENCOUNTER — Other Ambulatory Visit (HOSPITAL_COMMUNITY)
Admission: RE | Admit: 2017-01-30 | Discharge: 2017-01-30 | Disposition: A | Discharge status: Home / Self Care | Payer: Medicaid Other | Source: Ambulatory Visit | Attending: Obstetrics and Gynecology | Admitting: Obstetrics and Gynecology

## 2017-01-30 ENCOUNTER — Encounter: Payer: Self-pay | Admitting: Obstetrics and Gynecology

## 2017-01-30 ENCOUNTER — Ambulatory Visit (INDEPENDENT_AMBULATORY_CARE_PROVIDER_SITE_OTHER): Payer: Medicaid Other | Admitting: Obstetrics and Gynecology

## 2017-01-30 VITALS — BP 115/77 | HR 72 | Ht 68.0 in | Wt 150.4 lb

## 2017-01-30 DIAGNOSIS — Z01419 Encounter for gynecological examination (general) (routine) without abnormal findings: Secondary | ICD-10-CM | POA: Insufficient documentation

## 2017-01-30 DIAGNOSIS — Z3042 Encounter for surveillance of injectable contraceptive: Secondary | ICD-10-CM

## 2017-01-30 DIAGNOSIS — Z23 Encounter for immunization: Secondary | ICD-10-CM | POA: Diagnosis not present

## 2017-01-30 DIAGNOSIS — Z789 Other specified health status: Secondary | ICD-10-CM

## 2017-01-30 DIAGNOSIS — Z0000 Encounter for general adult medical examination without abnormal findings: Secondary | ICD-10-CM | POA: Diagnosis present

## 2017-01-30 MED ORDER — MEDROXYPROGESTERONE ACETATE 150 MG/ML IM SUSP
150.0000 mg | Freq: Once | INTRAMUSCULAR | Status: AC
  Administered 2017-01-30: 150 mg via INTRAMUSCULAR

## 2017-01-30 NOTE — Progress Notes (Signed)
GYNECOLOGY ANNUAL PREVENTATIVE CARE ENCOUNTER NOTE  Subjective:   Misty Hall is a 31 y.o. [phone] female here for a routine annual gynecologic exam.  Current complaints: None   Denies abnormal vaginal bleeding, discharge, pelvic pain, problems with intercourse or other gynecologic concerns.    Gynecologic History No LMP recorded. Contraception: Depo-Provera injections Last Pap: 01/02/2016. Results were: normal Last mammogram: NA.   Obstetric History OB History  Gravida Para Term Preterm AB Living  4 3 3   1 3   SAB TAB Ectopic Multiple Live Births  1     0 3    # Outcome Date GA Lbr Len/2nd Weight Sex Delivery Anes PTL Lv  4 Term 04/17/16 4689w6d 05:18 / 00:10 7 lb 0.4 oz (3.185 kg) F Vag-Spont Local, EPI  LIV  3 SAB 2016 3383w0d         2 Term 2009 219w0d  7 lb 11 oz (3.487 kg)  Vag-Spont None  LIV     Birth Comments: [place] complications[place]  1 Term 2003 409w0d  6 lb 7 oz (2.92 kg)  Vag-Spont EPI  LIV     Birth Comments: no complications, born in [place]      Past Medical History:  Diagnosis Date  . Hemoglobin A-S genotype (HCC)     History reviewed. No pertinent surgical history.  No current outpatient prescriptions on file prior to visit.   No current facility-administered medications on file prior to visit.     Allergies  Allergen Reactions  . Aleve [Naproxen Sodium] Hives    Social History   Social History  . Marital status: Single    Spouse name: N/A  . Number of children: N/A  . Years of education: N/A   Occupational History  . Not on file.   Social History Main Topics  . Smoking status: Never Smoker  . Smokeless tobacco: Never Used  . Alcohol use Yes     Comment: ocasionally  . Drug use: No  . Sexual activity: Yes    Birth control/ protection: None, Injection   Other Topics Concern  . Not on file   Social History Narrative  . No narrative on file    Family History  Problem Relation Age of Onset  . Stroke Mother     . Heart disease Father     The following portions of the patient's history were reviewed and updated as appropriate: allergies, current medications, past family history, past medical history, past social history, past surgical history and problem list.  Review of Systems A comprehensive review of systems was negative.   Objective:  BP 115/77   Pulse 72   Ht 5\' 8"  (1.727 m)   Wt 150 lb 6.4 oz (68.2 kg)   BMI 22.87 kg/m    BP 115/77   Pulse 72   Ht 5\' 8"  (1.727 m)   Wt 150 lb 6.4 oz (68.2 kg)   BMI 22.87 kg/m  CONSTITUTIONAL: Well-developed, well-nourished female in no acute distress.  HENT:  Normocephalic, atraumatic, External right and left ear normal. Oropharynx is clear and moist EYES: Conjunctivae and EOM are normal. Pupils are equal, round, and reactive to light. No scleral icterus.  NECK: Normal range of motion, supple, no masses.  Normal thyroid.  SKIN: Skin is warm and dry. No rash noted. Not diaphoretic. No erythema. No pallor. NEUROLOGIC: Alert and oriented to person, place, and time. Normal reflexes, muscle tone coordination. No cranial nerve deficit noted. PSYCHIATRIC: Normal mood and  affect. Normal behavior. Normal judgment and thought content. CARDIOVASCULAR: Normal heart rate noted, regular rhythm RESPIRATORY: Clear to auscultation bilaterally. Effort and breath sounds normal, no problems with respiration noted. BREASTS: Symmetric in size. No masses, skin changes, nipple drainage, or lymphadenopathy. ABDOMEN: Soft, normal bowel sounds, no distention noted.  No tenderness, rebound or guarding.  PELVIC: Normal appearing external genitalia; normal appearing vaginal mucosa and cervix.  Normal appearing discharge.  Pap smear obtained.  Normal uterine size, no other palpable masses, no uterine or adnexal tenderness. MUSCULOSKELETAL: Normal range of motion. No tenderness.  No cyanosis, clubbing, or edema.  2+ distal pulses.   Assessment:  Annual gynecologic examination  with pap smear   Plan:  Will follow up results of pap smear and manage accordingly. Routine preventative health maintenance measures emphasized. Please refer to After Visit Summary for other counseling recommendations.    Rasch, Harolyn Rutherford, NP [facility]

## 2017-02-05 LAB — CYTOLOGY - PAP
DIAGNOSIS: NEGATIVE
HPV: NOT DETECTED

## 2017-03-18 ENCOUNTER — Emergency Department (HOSPITAL_COMMUNITY)
Admission: EM | Admit: 2017-03-18 | Discharge: 2017-03-18 | Disposition: A | Discharge status: Home / Self Care | Payer: Medicaid Other | Attending: Physician Assistant | Admitting: Physician Assistant

## 2017-03-18 ENCOUNTER — Encounter (HOSPITAL_COMMUNITY): Payer: Self-pay | Admitting: Nurse Practitioner

## 2017-03-18 DIAGNOSIS — Z5321 Procedure and treatment not carried out due to patient leaving prior to being seen by health care provider: Secondary | ICD-10-CM | POA: Diagnosis not present

## 2017-03-18 DIAGNOSIS — R21 Rash and other nonspecific skin eruption: Secondary | ICD-10-CM | POA: Diagnosis not present

## 2017-03-18 NOTE — ED Triage Notes (Signed)
Pt presents with c/o skin problem. She noticed an itchy painful red area to back of her left thigh last night. She tried cleaning the area with alcohol with no relief.

## 2017-03-18 NOTE — ED Notes (Signed)
Does not answer

## 2017-05-02 ENCOUNTER — Ambulatory Visit (INDEPENDENT_AMBULATORY_CARE_PROVIDER_SITE_OTHER): Payer: Medicaid Other

## 2017-05-02 VITALS — BP 120/81 | HR 85 | Wt 151.9 lb

## 2017-05-02 DIAGNOSIS — Z3042 Encounter for surveillance of injectable contraceptive: Secondary | ICD-10-CM | POA: Diagnosis present

## 2017-05-02 MED ORDER — MEDROXYPROGESTERONE ACETATE 150 MG/ML IM SUSP
150.0000 mg | Freq: Once | INTRAMUSCULAR | Status: AC
  Administered 2017-05-02: 150 mg via INTRAMUSCULAR

## 2017-05-02 NOTE — Progress Notes (Signed)
Patient presented to the office for depo injection. Patient tolerated well. Next Depo is due Nov 22-Dec 6.

## 2017-07-22 ENCOUNTER — Ambulatory Visit: Payer: Self-pay

## 2017-07-23 ENCOUNTER — Ambulatory Visit: Payer: Medicaid Other

## 2017-07-23 VITALS — BP 112/64 | HR 74

## 2017-07-23 DIAGNOSIS — Z3042 Encounter for surveillance of injectable contraceptive: Secondary | ICD-10-CM | POA: Diagnosis not present

## 2017-07-23 DIAGNOSIS — Z30019 Encounter for initial prescription of contraceptives, unspecified: Secondary | ICD-10-CM

## 2017-07-23 MED ORDER — MEDROXYPROGESTERONE ACETATE 150 MG/ML IM SUSP
150.0000 mg | Freq: Once | INTRAMUSCULAR | Status: AC
  Administered 2017-07-23: 150 mg via INTRAMUSCULAR

## 2017-07-23 NOTE — Progress Notes (Signed)
Agree with nursing staff's documentation of this patient's clinic encounter.  Vonzella NippleJulie Deven Audi, PA-C 07/23/2017 4:32 PM

## 2017-07-23 NOTE — Progress Notes (Signed)
Patient presented to the office for a Depo injection. 

## 2017-10-08 ENCOUNTER — Ambulatory Visit (INDEPENDENT_AMBULATORY_CARE_PROVIDER_SITE_OTHER): Payer: Medicaid Other

## 2017-10-08 VITALS — BP 107/72 | HR 78

## 2017-10-08 DIAGNOSIS — Z3042 Encounter for surveillance of injectable contraceptive: Secondary | ICD-10-CM

## 2017-10-08 MED ORDER — MEDROXYPROGESTERONE ACETATE 150 MG/ML IM SUSP
150.0000 mg | Freq: Once | INTRAMUSCULAR | Status: AC
  Administered 2017-10-08: 150 mg via INTRAMUSCULAR

## 2017-10-08 NOTE — Addendum Note (Signed)
Addended by: Lorelle GibbsWILSON, Toddy Boyd L on: 10/08/2017 02:08 PM   Modules accepted: Orders

## 2017-10-08 NOTE — Progress Notes (Signed)
Patient presented to the office for a Depo injection. Next Depo is due April 30-May 14.

## 2017-10-09 NOTE — Progress Notes (Signed)
Reviewed chart and nursing note. Agree with documentation.  Raynelle FanningJulie P. Degele, MD OB Fellow

## 2017-12-25 ENCOUNTER — Ambulatory Visit (INDEPENDENT_AMBULATORY_CARE_PROVIDER_SITE_OTHER): Payer: Medicaid Other | Admitting: *Deleted

## 2017-12-25 VITALS — BP 117/75 | HR 93

## 2017-12-25 DIAGNOSIS — Z3042 Encounter for surveillance of injectable contraceptive: Secondary | ICD-10-CM | POA: Diagnosis not present

## 2017-12-25 MED ORDER — MEDROXYPROGESTERONE ACETATE 150 MG/ML IM SUSP
150.0000 mg | Freq: Once | INTRAMUSCULAR | Status: AC
  Administered 2017-12-25: 150 mg via INTRAMUSCULAR

## 2017-12-25 NOTE — Progress Notes (Signed)
Patient here for depo.  I have reviewed the chart and agree with nursing staff's documentation of this patient's encounter.  Thressa Sheller, CNM 12/25/2017 3:55 PM

## 2018-03-13 ENCOUNTER — Ambulatory Visit: Payer: Self-pay

## 2018-03-18 ENCOUNTER — Ambulatory Visit (INDEPENDENT_AMBULATORY_CARE_PROVIDER_SITE_OTHER): Payer: Medicaid Other | Admitting: Advanced Practice Midwife

## 2018-03-18 ENCOUNTER — Other Ambulatory Visit (HOSPITAL_COMMUNITY)
Admission: RE | Admit: 2018-03-18 | Discharge: 2018-03-18 | Disposition: A | Discharge status: Home / Self Care | Payer: Medicaid Other | Source: Ambulatory Visit | Attending: Advanced Practice Midwife | Admitting: Advanced Practice Midwife

## 2018-03-18 ENCOUNTER — Encounter: Payer: Self-pay | Admitting: Advanced Practice Midwife

## 2018-03-18 VITALS — BP 120/75 | HR 70 | Ht 69.0 in | Wt 161.4 lb

## 2018-03-18 DIAGNOSIS — Z01419 Encounter for gynecological examination (general) (routine) without abnormal findings: Secondary | ICD-10-CM

## 2018-03-18 DIAGNOSIS — Z3042 Encounter for surveillance of injectable contraceptive: Secondary | ICD-10-CM

## 2018-03-18 DIAGNOSIS — Z308 Encounter for other contraceptive management: Secondary | ICD-10-CM | POA: Diagnosis not present

## 2018-03-18 DIAGNOSIS — Z0000 Encounter for general adult medical examination without abnormal findings: Secondary | ICD-10-CM

## 2018-03-18 MED ORDER — MEDROXYPROGESTERONE ACETATE 150 MG/ML IM SUSP
150.0000 mg | Freq: Once | INTRAMUSCULAR | Status: AC
  Administered 2018-03-18: 150 mg via INTRAMUSCULAR

## 2018-03-18 NOTE — Patient Instructions (Signed)
October 8-22

## 2018-03-18 NOTE — Progress Notes (Signed)
GYNECOLOGY ANNUAL PREVENTATIVE CARE ENCOUNTER NOTE  Subjective:   Misty Hall is a 32 y.o. [phone] female here for a routine annual gynecologic exam.  Current complaints: none.   Denies abnormal vaginal bleeding, discharge, pelvic pain, problems with intercourse or other gynecologic concerns.    Gynecologic History No LMP recorded. Patient has had an injection. Contraception: Depo-Provera injections Last Pap: 6/18. Results were: normal, offered patient extended interval for pap, but she would like to have pap today Last mammogram: NA. Results were: NA  Obstetric History OB History  Gravida Para Term Preterm AB Living  4 3 3  0 1 3  SAB TAB Ectopic Multiple Live Births  1 0 0 0 3    # Outcome Date GA Lbr Len/2nd Weight Sex Delivery Anes PTL Lv  4 Term 04/17/16 [redacted]w[redacted]d 05:18 / 00:10 7 lb 0.4 oz (3.185 kg) F Vag-Spont Local, EPI  LIV  3 SAB 2016 [redacted]w[redacted]d         2 Term 2009 [redacted]w[redacted]d  7 lb 11 oz (3.487 kg)  Vag-Spont None  LIV     Birth Comments: no complications, born in [place]  1 Term 2003 [redacted]w[redacted]d  6 lb 7 oz (2.92 kg)  Vag-Spont EPI  LIV     Birth Comments: no complications, born in [place]    Past Medical History:  Diagnosis Date  . Hemoglobin A-S genotype (HCC)   . Medical history non-contributory     Past Surgical History:  Procedure Laterality Date  . NO PAST SURGERIES      No current outpatient medications on file prior to visit.   No current facility-administered medications on file prior to visit.     Allergies  Allergen Reactions  . Aleve [Naproxen Sodium] Hives    Social History   Socioeconomic History  . Marital status: Single    Spouse name: Not on file  . Number of children: Not on file  . Years of education: Not on file  . Highest education level: Not on file  Occupational History  . Not on file  Social Needs  . Financial resource strain: Not on file  . Food insecurity:    Worry: Not on file    Inability: Not on file  . Transportation  needs:    Medical: Not on file    Non-medical: Not on file  Tobacco Use  . Smoking status: Never Smoker  . Smokeless tobacco: Never Used  Substance and Sexual Activity  . Alcohol use: Yes    Comment: ocasionally  . Drug use: No  . Sexual activity: Yes    Birth control/protection: Injection  Lifestyle  . Physical activity:    Days per week: Not on file    Minutes per session: Not on file  . Stress: Not on file  Relationships  . Social connections:    Talks on phone: Not on file    Gets together: Not on file    Attends religious service: Not on file    Active member of club or organization: Not on file    Attends meetings of clubs or organizations: Not on file    Relationship status: Not on file  . Intimate partner violence:    Fear of current or ex partner: Not on file    Emotionally abused: Not on file    Physically abused: Not on file    Forced sexual activity: Not on file  Other Topics Concern  . Not on file  Social History Narrative  . Not on file  Family History  Problem Relation Age of Onset  . Stroke Mother   . Heart disease Father     The following portions of the patient's history were reviewed and updated as appropriate: allergies, current medications, past family history, past medical history, past social history, past surgical history and problem list.  Review of Systems Pertinent items noted in HPI and remainder of comprehensive ROS otherwise negative.   Objective:  BP 120/75   Pulse 70   Ht 5\' 9"  (1.753 m)   Wt 161 lb 6.4 oz (73.2 kg)   BMI 23.83 kg/m  CONSTITUTIONAL: Well-developed, well-nourished female in no acute distress.  HENT:  Normocephalic, atraumatic, External right and left ear normal. Oropharynx is clear and moist EYES: Conjunctivae and EOM are normal. Pupils are equal, round, and reactive to light. No scleral icterus.  NECK: Normal range of motion, supple, no masses.  Normal thyroid.  SKIN: Skin is warm and dry. No rash noted. Not  diaphoretic. No erythema. No pallor. NEUROLOGIC: Alert and oriented to person, place, and time. Normal reflexes, muscle tone coordination. No cranial nerve deficit noted. PSYCHIATRIC: Normal mood and affect. Normal behavior. Normal judgment and thought content. CARDIOVASCULAR: Normal heart rate noted, regular rhythm RESPIRATORY: Clear to auscultation bilaterally. Effort and breath sounds normal, no problems with respiration noted. BREASTS: Symmetric in size. No masses, skin changes, nipple drainage, or lymphadenopathy. ABDOMEN: Soft, normal bowel sounds, no distention noted.  No tenderness, rebound or guarding.  PELVIC: Normal appearing external genitalia; normal appearing vaginal mucosa and cervix.  No abnormal discharge noted.  Pap smear obtained.  Normal uterine size, no other palpable masses, no uterine or adnexal tenderness. MUSCULOSKELETAL: Normal range of motion. No tenderness.  No cyanosis, clubbing, or edema.  2+ distal pulses.   Assessment and Plan:   1. Well woman exam with routine gynecological exam 2. Encounter for other contraceptive management  - Cytology - PAP - medroxyPROGESTERone (DEPO-PROVERA) injection 150 mg  Will follow up results of pap smear and manage accordingly. Continue depo Routine preventative health maintenance measures emphasized. Please refer to After Visit Summary for other counseling recommendations.

## 2018-03-18 NOTE — Progress Notes (Signed)
[name] here for Depo-Provera  Injection.  Injection administered without complication. Patient will return in 3 months for next injection.  Misty Hall, CMA 03/18/2018  5:29 PM

## 2018-03-21 LAB — CYTOLOGY - PAP
CHLAMYDIA, DNA PROBE: NEGATIVE
Diagnosis: NEGATIVE
Neisseria Gonorrhea: NEGATIVE

## 2018-06-03 ENCOUNTER — Ambulatory Visit: Payer: Self-pay

## 2018-06-04 ENCOUNTER — Ambulatory Visit (INDEPENDENT_AMBULATORY_CARE_PROVIDER_SITE_OTHER): Payer: Medicaid Other | Admitting: General Practice

## 2018-06-04 VITALS — BP 126/72 | HR 72 | Ht 69.0 in | Wt 169.0 lb

## 2018-06-04 DIAGNOSIS — Z3042 Encounter for surveillance of injectable contraceptive: Secondary | ICD-10-CM | POA: Diagnosis present

## 2018-06-04 MED ORDER — MEDROXYPROGESTERONE ACETATE 150 MG/ML IM SUSP
150.0000 mg | Freq: Once | INTRAMUSCULAR | Status: AC
  Administered 2018-06-04: 150 mg via INTRAMUSCULAR

## 2018-06-04 NOTE — Progress Notes (Signed)
Misty Hall here for Depo-Provera  Injection.  Injection administered without complication. Patient will return in 3 months for next injection.  Marylynn Pearson, RN 06/04/2018  10:34 AM

## 2018-06-09 NOTE — Progress Notes (Signed)
I have reviewed the chart and agree with nursing staff's documentation of this patient's encounter.  Ahmon Tosi, MD 06/09/2018 11:21 AM    

## 2018-08-25 ENCOUNTER — Ambulatory Visit (INDEPENDENT_AMBULATORY_CARE_PROVIDER_SITE_OTHER): Payer: Medicaid Other | Admitting: General Practice

## 2018-08-25 VITALS — BP 127/78 | HR 89 | Ht 68.0 in | Wt 183.0 lb

## 2018-08-25 DIAGNOSIS — Z3042 Encounter for surveillance of injectable contraceptive: Secondary | ICD-10-CM | POA: Diagnosis not present

## 2018-08-25 MED ORDER — MEDROXYPROGESTERONE ACETATE 150 MG/ML IM SUSP
150.0000 mg | Freq: Once | INTRAMUSCULAR | Status: AC
  Administered 2018-08-25: 150 mg via INTRAMUSCULAR

## 2018-08-25 NOTE — Progress Notes (Signed)
I have reviewed the chart and agree with nursing staff's documentation of this patient's encounter.  Thressa ShellerHeather Aidynn Polendo DNP, CNM  08/25/18  11:36 AM

## 2018-08-25 NOTE — Progress Notes (Signed)
Dellis AnesSamantha Gryder here for Depo-Provera  Injection.  Injection administered without complication. Patient will return in 3 months for next injection.  Marylynn PearsonCarrie Morna Flud, RN 08/25/2018  9:38 AM

## 2018-09-16 ENCOUNTER — Ambulatory Visit: Payer: Self-pay | Admitting: Medical

## 2018-11-10 ENCOUNTER — Telehealth: Payer: Self-pay | Admitting: Family Medicine

## 2018-11-10 NOTE — Telephone Encounter (Signed)
Called patient to inform about the restrictions at the office due to the coronavirus. Patient verbalized understanding. 

## 2018-11-11 ENCOUNTER — Ambulatory Visit (INDEPENDENT_AMBULATORY_CARE_PROVIDER_SITE_OTHER): Payer: Medicaid Other | Admitting: Family Medicine

## 2018-11-11 ENCOUNTER — Ambulatory Visit: Payer: Medicaid Other | Admitting: General Practice

## 2018-11-11 ENCOUNTER — Other Ambulatory Visit: Payer: Self-pay

## 2018-11-11 ENCOUNTER — Encounter: Payer: Self-pay | Admitting: Family Medicine

## 2018-11-11 VITALS — BP 117/72 | HR 83 | Temp 98.5°F | Wt 197.3 lb

## 2018-11-11 DIAGNOSIS — Z3009 Encounter for other general counseling and advice on contraception: Secondary | ICD-10-CM | POA: Diagnosis present

## 2018-11-11 DIAGNOSIS — Z3042 Encounter for surveillance of injectable contraceptive: Secondary | ICD-10-CM

## 2018-11-11 MED ORDER — NORELGESTROMIN-ETH ESTRADIOL 150-35 MCG/24HR TD PTWK: 1.0000 | MEDICATED_PATCH | TRANSDERMAL | 12 refills | Status: DC

## 2018-11-11 NOTE — Progress Notes (Signed)
Patient presents to office for depo injection. Patient requests different birth control option like the patch because she has gained too much weight on depo provera. Discussed she will need to meet with a provider to talk about different options and which she would be a candidate for. Advised she could return during after hours clinic today and be seen for concerns. Patient verbalized understanding & states she will do that. Patient had no questions at this time.  Chase Caller RN BSN 11/11/18

## 2018-11-11 NOTE — Progress Notes (Signed)
Wants to switch from depo-provera to patch

## 2018-11-11 NOTE — Progress Notes (Signed)
Chart reviewed for nurse visit. Agree with plan of care.   Judeth Horn, NP 11/11/2018 10:53 AM

## 2018-11-11 NOTE — Progress Notes (Signed)
   GYNECOLOGY OFFICE VISIT NOTE History:  33 y.o. [id number] here today for changes in birth control. Has gained 70lbs with depo. She denies any abnormal vaginal discharge, bleeding, pelvic pain or other concerns. Has not had a period since being on depo. When she was younger did not gain any weight with depo but since having her third child she has had continued weight gain. Was hoping to switch to the patch. Denies any problems with high blood pressure, blood clots history. Non-smoker. Denies any changes to dietary intake.   The following portions of the patient's history were reviewed and updated as appropriate: allergies, current medications, past family history, past medical history, past social history, past surgical history and problem list.   Health Maintenance:  Normal pap July 2019.   Review of Systems:  Pertinent items noted in HPI ROS  Objective:  Physical Exam BP 117/72   Pulse 83   Temp 98.5 F (36.9 C)   Wt 197 lb 4.8 oz (89.5 kg)   LMP  (LMP Unknown) Comment: irregular on depo-provera  BMI 30.00 kg/m  Physical Exam  Constitutional: She is oriented to person, place, and time. She appears well-developed and well-nourished. No distress.  HENT:  Head: Normocephalic and atraumatic.  Eyes: Conjunctivae and EOM are normal. No scleral icterus.  Cardiovascular: Normal rate.  Pulmonary/Chest: Effort normal. No respiratory distress.  Genitourinary:    Genitourinary Comments: deferred   Musculoskeletal:        General: No edema.  Neurological: She is alert and oriented to person, place, and time.  Psychiatric: She has a normal mood and affect. Her behavior is normal.  Nursing note and vitals reviewed.  Labs and Imaging No results found for this or any previous visit (from the past 168 hour(s)). No results found.  Assessment & Plan:  14SA [id number] non-pregnant female who presents for contraceptive counseling. Would like to switch from depo to patch. Feel this is reasonable,  counseled on use. Rx sent to pharmacy. Counseled on potential side effects, reviewed return precautions. Pap smear up-to-date.   Routine preventative health maintenance measures emphasized.  Please refer to After Visit Summary for other counseling recommendations.   Cristal Deer. Earlene Plater, DO OB Family Medicine Fellow, [facility] for [employer], [facility]

## 2018-11-11 NOTE — Patient Instructions (Signed)
www.bedsider.org (more information on the patch and how it's used)  Ethinyl Estradiol; Norelgestromin skin patches What is this medicine? ETHINYL ESTRADIOL;NORELGESTROMIN (ETH in il es tra DYE ole; nor el JES troe min) skin patch is used as a contraceptive (birth control method). This medicine combines two types of female hormones, an estrogen and a progestin. This patch is used to prevent ovulation and pregnancy. This medicine may be used for other purposes; ask your health care provider or pharmacist if you have questions. COMMON BRAND NAME(S): Ortho Christianne Borrow What should I tell my health care provider before I take this medicine? They need to know if you have or ever had any of these conditions: -abnormal vaginal bleeding -blood vessel disease or blood clots -breast, cervical, endometrial, ovarian, liver, or uterine cancer -diabetes -gallbladder disease -heart disease or recent heart attack -high blood pressure -high cholesterol -kidney disease -liver disease -migraine headaches -stroke -systemic lupus erythematosus (SLE) -tobacco smoker -an unusual or allergic reaction to estrogens, progestins, other medicines, foods, dyes, or preservatives -pregnant or trying to get pregnant -breast-feeding How should I use this medicine? This patch is applied to the skin. Follow the directions on the prescription label. Apply to clean, dry, healthy skin on the buttock, abdomen, upper outer arm or upper torso, in a place where it will not be rubbed by tight clothing. Do not use lotions or other cosmetics on the site where the patch will go. Press the patch firmly in place for 10 seconds to ensure good contact with the skin. Change the patch every 7 days on the same day of the week for 3 weeks. You will then have a break from the patch for 1 week, after which you will apply a new patch. Do not use your medicine more often than directed. Contact your pediatrician regarding the use of this medicine  in children. Special care may be needed. This medicine has been used in female children who have started having menstrual periods. A patient package insert for the product will be given with each prescription and refill. Read this sheet carefully each time. The sheet may change frequently. Overdosage: If you think you have taken too much of this medicine contact a poison control center or emergency room at once. NOTE: This medicine is only for you. Do not share this medicine with others. What if I miss a dose? You will need to replace your patch once a week as directed. If your patch is lost or falls off, contact your health care professional for advice. You may need to use another form of birth control if your patch has been off for more than 1 day. What may interact with this medicine? Do not take this medicine with the following medication: -dasabuvir; ombitasvir; paritaprevir; ritonavir -ombitasvir; paritaprevir; ritonavir This medicine may also interact with the following medications: -acetaminophen -antibiotics or medicines for infections, especially rifampin, rifabutin, rifapentine, and griseofulvin, and possibly penicillins or tetracyclines -aprepitant -ascorbic acid (vitamin C) -atorvastatin -barbiturate medicines, such as phenobarbital -bosentan -carbamazepine -caffeine -clofibrate -cyclosporine -dantrolene -doxercalciferol -felbamate -grapefruit juice -hydrocortisone -medicines for anxiety or sleeping problems, such as diazepam or temazepam -medicines for diabetes, including pioglitazone -modafinil -mycophenolate -nefazodone -oxcarbazepine -phenytoin -prednisolone -ritonavir or other medicines for HIV infection or AIDS -rosuvastatin -selegiline -soy isoflavones supplements -St. John's wort -tamoxifen or raloxifene -theophylline -thyroid hormones -topiramate -warfarin This list may not describe all possible interactions. Give your health care provider a list of  all the medicines, herbs, non-prescription drugs, or dietary supplements you use. Also  tell them if you smoke, drink alcohol, or use illegal drugs. Some items may interact with your medicine. What should I watch for while using this medicine? Visit your doctor or health care professional for regular checks on your progress. You will need a regular breast and pelvic exam and Pap smear while on this medicine. Use an additional method of contraception during the first cycle that you use this patch. If you have any reason to think you are pregnant, stop using this medicine right away and contact your doctor or health care professional. If you are using this medicine for hormone related problems, it may take several cycles of use to see improvement in your condition. Smoking increases the risk of getting a blood clot or having a stroke while you are using hormonal birth control, especially if you are more than 36 years old. You are strongly advised not to smoke. This medicine can make your body retain fluid, making your fingers, hands, or ankles swell. Your blood pressure can go up. Contact your doctor or health care professional if you feel you are retaining fluid. This medicine can make you more sensitive to the sun. Keep out of the sun. If you cannot avoid being in the sun, wear protective clothing and use sunscreen. Do not use sun lamps or tanning beds/booths. If you wear contact lenses and notice visual changes, or if the lenses begin to feel uncomfortable, consult your eye care specialist. In some women, tenderness, swelling, or minor bleeding of the gums may occur. Notify your dentist if this happens. Brushing and flossing your teeth regularly may help limit this. See your dentist regularly and inform your dentist of the medicines you are taking. If you are going to have elective surgery or a MRI, you may need to stop using this medicine before the surgery or MRI. Consult your health care professional  for advice. This medicine does not protect you against HIV infection (AIDS) or any other sexually transmitted diseases. What side effects may I notice from receiving this medicine? Side effects that you should report to your doctor or health care professional as soon as possible: -breast tissue changes or discharge -changes in vaginal bleeding during your period or between your periods -chest pain -coughing up blood -dizziness or fainting spells -headaches or migraines -leg, arm or groin pain -severe or sudden headaches -stomach pain (severe) -sudden shortness of breath -sudden loss of coordination, especially on one side of the body -speech problems -symptoms of vaginal infection like itching, irritation or unusual discharge -tenderness in the upper abdomen -vomiting -weakness or numbness in the arms or legs, especially on one side of the body -yellowing of the eyes or skin Side effects that usually do not require medical attention (report to your doctor or health care professional if they continue or are bothersome): -breakthrough bleeding and spotting that continues beyond the 3 initial cycles of pills -breast tenderness -mood changes, anxiety, depression, frustration, anger, or emotional outbursts -increased sensitivity to sun or ultraviolet light -nausea -skin rash, acne, or brown spots on the skin -weight gain (slight) This list may not describe all possible side effects. Call your doctor for medical advice about side effects. You may report side effects to FDA at 1-800-FDA-1088. Where should I keep my medicine? Keep out of the reach of children. Store at room temperature between 15 and 30 degrees C (59 and 86 degrees F). Keep the patch in its pouch until time of use. Throw away any unused medicine after the expiration  date. Dispose of used patches properly. Since a used patch may still contain active hormones, fold the patch in half so that it sticks to itself prior to  disposal. Throw away in a place where children or pets cannot reach. NOTE: This sheet is a summary. It may not cover all possible information. If you have questions about this medicine, talk to your doctor, pharmacist, or health care provider.  2019 Elsevier/Gold Standard (2016-04-23 07:59:03)

## 2019-06-26 ENCOUNTER — Other Ambulatory Visit: Payer: Self-pay

## 2019-06-26 DIAGNOSIS — Z20822 Contact with and (suspected) exposure to covid-19: Secondary | ICD-10-CM

## 2019-06-29 LAB — NOVEL CORONAVIRUS, NAA: SARS-CoV-2, NAA: NOT DETECTED

## 2019-12-16 ENCOUNTER — Ambulatory Visit: Payer: Medicaid Other | Attending: Internal Medicine

## 2019-12-16 DIAGNOSIS — Z20822 Contact with and (suspected) exposure to covid-19: Secondary | ICD-10-CM

## 2019-12-17 LAB — NOVEL CORONAVIRUS, NAA: SARS-CoV-2, NAA: NOT DETECTED

## 2019-12-17 LAB — SARS-COV-2, NAA 2 DAY TAT

## 2020-03-09 ENCOUNTER — Ambulatory Visit: Payer: Medicaid Other | Attending: Internal Medicine

## 2020-03-09 DIAGNOSIS — Z20822 Contact with and (suspected) exposure to covid-19: Secondary | ICD-10-CM

## 2020-03-10 LAB — SARS-COV-2, NAA 2 DAY TAT

## 2020-03-10 LAB — NOVEL CORONAVIRUS, NAA: SARS-CoV-2, NAA: NOT DETECTED

## 2021-05-08 ENCOUNTER — Ambulatory Visit: Payer: Medicaid Other | Admitting: Obstetrics and Gynecology

## 2021-05-29 ENCOUNTER — Ambulatory Visit (INDEPENDENT_AMBULATORY_CARE_PROVIDER_SITE_OTHER): Payer: Medicaid Other | Admitting: Obstetrics and Gynecology

## 2021-05-29 ENCOUNTER — Other Ambulatory Visit: Payer: Self-pay

## 2021-05-29 ENCOUNTER — Encounter: Payer: Self-pay | Admitting: Obstetrics and Gynecology

## 2021-05-29 ENCOUNTER — Other Ambulatory Visit (HOSPITAL_COMMUNITY)
Admission: RE | Admit: 2021-05-29 | Discharge: 2021-05-29 | Disposition: A | Discharge status: Home / Self Care | Payer: Medicaid Other | Source: Ambulatory Visit | Attending: Obstetrics and Gynecology | Admitting: Obstetrics and Gynecology

## 2021-05-29 DIAGNOSIS — Z202 Contact with and (suspected) exposure to infections with a predominantly sexual mode of transmission: Secondary | ICD-10-CM | POA: Insufficient documentation

## 2021-05-29 DIAGNOSIS — Z01419 Encounter for gynecological examination (general) (routine) without abnormal findings: Secondary | ICD-10-CM | POA: Insufficient documentation

## 2021-05-29 DIAGNOSIS — Z3202 Encounter for pregnancy test, result negative: Secondary | ICD-10-CM | POA: Diagnosis not present

## 2021-05-29 DIAGNOSIS — Z3042 Encounter for surveillance of injectable contraceptive: Secondary | ICD-10-CM

## 2021-05-29 LAB — POCT PREGNANCY, URINE: Preg Test, Ur: NEGATIVE

## 2021-05-29 MED ORDER — MEDROXYPROGESTERONE ACETATE 150 MG/ML IM SUSP
150.0000 mg | Freq: Once | INTRAMUSCULAR | Status: AC
  Administered 2021-05-29: 150 mg via INTRAMUSCULAR

## 2021-05-29 NOTE — Progress Notes (Addendum)
Misty Hall is a 35 y.o. [phone] female here for a routine annual gynecologic exam.  Current complaints: NO GYN concerns. Wants to restart Depo Provera . Has used in the past without problems.   Denies abnormal vaginal bleeding, discharge, pelvic pain, problems with intercourse or other gynecologic concerns.    Gynecologic History Patient's last menstrual period was 05/02/2021. Contraception: Depo-Provera injections Last Pap: 02/2018. Results were: normal Last mammogram: NA.  Obstetric History OB History  Gravida Para Term Preterm AB Living  4 3 3  0 1 3  SAB IAB Ectopic Multiple Live Births  1 0 0 0 3    # Outcome Date GA Lbr Len/2nd Weight Sex Delivery Anes PTL Lv  4 Term 04/17/16 [redacted]w[redacted]d 05:18 / 00:10 7 lb 0.4 oz (3.185 kg) F Vag-Spont Local, EPI  LIV  3 SAB 2016 [redacted]w[redacted]d         2 Term 2009 [redacted]w[redacted]d  7 lb 11 oz (3.487 kg)  Vag-Spont None  LIV     Birth Comments: no complications, born in [redacted[place]w[redacted[place]d  1 Term 2003 [redacted]w[redacted]d  6 lb 7 oz (2.92 kg)  Vag-Spont EPI  LIV     Birth Comments: no complications, born in [redacted]w[redacted]d    Past Medical History:  Diagnosis Date   Hemoglobin A-S genotype (HCC)    Medical history non-contributory     Past Surgical History:  Procedure Laterality Date   NO PAST SURGERIES      No current outpatient medications on file prior to visit.   No current facility-administered medications on file prior to visit.    Allergies  Allergen Reactions   Aleve [Naproxen Sodium] Hives    Social History   Socioeconomic History   Marital status: Single    Spouse name: Not on file   Number of children: Not on file   Years of education: Not on file   Highest education level: Not on file  Occupational History   Not on file  Tobacco Use   Smoking status: Never   Smokeless tobacco: Never  Vaping Use   Vaping Use: Never used  Substance and Sexual Activity   Alcohol use: Yes    Comment: ocasionally   Drug use: No   Sexual activity: Yes    Birth  control/protection: Injection  Other Topics Concern   Not on file  Social History Narrative   Not on file   Social Determinants of Health   Financial Resource Strain: Not on file  Food Insecurity: Not on file  Transportation Needs: Not on file  Physical Activity: Not on file  Stress: Not on file  Social Connections: Not on file  Intimate Partner Violence: Not on file    Family History  Problem Relation Age of Onset   Stroke Mother    Heart disease Father     The following portions of the patient's history were reviewed and updated as appropriate: allergies, current medications, past family history, past medical history, past social history, past surgical history and problem list.  Review of Systems Pertinent items noted in HPI and remainder of comprehensive ROS otherwise negative.   Objective:  BP 112/83   Pulse 76   Ht 5\' 8"  (1.727 m)   Wt 165 lb 8 oz (75.1 kg)   LMP 05/02/2021   BMI 25.16 kg/m   Chaperone present during exam  CONSTITUTIONAL: Well-developed, well-nourished female in no acute distress.  HENT:  Normocephalic, atraumatic, External right and left ear normal. Oropharynx is clear and moist EYES: Conjunctivae and  EOM are normal. Pupils are equal, round, and reactive to light. No scleral icterus.  NECK: Normal range of motion, supple, no masses.  Normal thyroid.  SKIN: Skin is warm and dry. No rash noted. Not diaphoretic. No erythema. No pallor. NEUROLGIC: Alert and oriented to person, place, and time. Normal reflexes, muscle tone coordination. No cranial nerve deficit noted. PSYCHIATRIC: Normal mood and affect. Normal behavior. Normal judgment and thought content. CARDIOVASCULAR: Normal heart rate noted, regular rhythm RESPIRATORY: Clear to auscultation bilaterally. Effort and breath sounds normal, no problems with respiration noted. BREASTS: Symmetric in size. No masses, skin changes, nipple drainage, or lymphadenopathy. ABDOMEN: Soft, normal bowel sounds,  no distention noted.  No tenderness, rebound or guarding.  PELVIC: Normal appearing external genitalia; normal appearing vaginal mucosa and cervix.  No abnormal discharge noted.  Pap smear obtained.  Normal uterine size, no other palpable masses, no uterine or adnexal tenderness. MUSCULOSKELETAL: Normal range of motion. No tenderness.  No cyanosis, clubbing, or edema.  2+ distal pulses.   Assessment:  Annual gynecologic examination with pap smear Contraception management STD exposure  Plan:  Will follow up results of pap smear and manage accordingly. UPT negative today. STD test today Routine preventative health maintenance measures emphasized. Please refer to After Visit Summary for other counseling recommendations.    Hermina Staggers, MD, FACOG Attending Obstetrician & Gynecologist Center for [facility], [facility]

## 2021-05-29 NOTE — Addendum Note (Signed)
Addended byVidal Schwalbe on: 05/29/2021 09:31 AM   Modules accepted: Orders

## 2021-05-29 NOTE — Patient Instructions (Signed)
Health Maintenance, Female Adopting a healthy lifestyle and getting preventive care are important in promoting health and wellness. Ask your health care provider about: The right schedule for you to have regular tests and exams. Things you can do on your own to prevent diseases and keep yourself healthy. What should I know about diet, weight, and exercise? Eat a healthy diet  Eat a diet that includes plenty of vegetables, fruits, low-fat dairy products, and lean protein. Do not eat a lot of foods that are high in solid fats, added sugars, or sodium. Maintain a healthy weight Body mass index (BMI) is used to identify weight problems. It estimates body fat based on height and weight. Your health care provider can help determine your BMI and help you achieve or maintain a healthy weight. Get regular exercise Get regular exercise. This is one of the most important things you can do for your health. Most adults should: Exercise for at least 150 minutes each week. The exercise should increase your heart rate and make you sweat (moderate-intensity exercise). Do strengthening exercises at least twice a week. This is in addition to the moderate-intensity exercise. Spend less time sitting. Even light physical activity can be beneficial. Watch cholesterol and blood lipids Have your blood tested for lipids and cholesterol at 37 years of age, then have this test every 5 years. Have your cholesterol levels checked more often if: Your lipid or cholesterol levels are high. You are older than 37 years of age. You are at high risk for heart disease. What should I know about cancer screening? Depending on your health history and family history, you may need to have cancer screening at various ages. This may include screening for: Breast cancer. Cervical cancer. Colorectal cancer. Skin cancer. Lung cancer. What should I know about heart disease, diabetes, and high blood pressure? Blood pressure and heart  disease High blood pressure causes heart disease and increases the risk of stroke. This is more likely to develop in people who have high blood pressure readings, are of African descent, or are overweight. Have your blood pressure checked: Every 3-5 years if you are 18-39 years of age. Every year if you are 40 years old or older. Diabetes Have regular diabetes screenings. This checks your fasting blood sugar level. Have the screening done: Once every three years after age 40 if you are at a normal weight and have a low risk for diabetes. More often and at a younger age if you are overweight or have a high risk for diabetes. What should I know about preventing infection? Hepatitis B If you have a higher risk for hepatitis B, you should be screened for this virus. Talk with your health care provider to find out if you are at risk for hepatitis B infection. Hepatitis C Testing is recommended for: Everyone born from 1945 through 1965. Anyone with known risk factors for hepatitis C. Sexually transmitted infections (STIs) Get screened for STIs, including gonorrhea and chlamydia, if: You are sexually active and are younger than 37 years of age. You are older than 37 years of age and your health care provider tells you that you are at risk for this type of infection. Your sexual activity has changed since you were last screened, and you are at increased risk for chlamydia or gonorrhea. Ask your health care provider if you are at risk. Ask your health care provider about whether you are at high risk for HIV. Your health care provider may recommend a prescription medicine   to help prevent HIV infection. If you choose to take medicine to prevent HIV, you should first get tested for HIV. You should then be tested every 3 months for as long as you are taking the medicine. Pregnancy If you are about to stop having your period (premenopausal) and you may become pregnant, seek counseling before you get  pregnant. Take 400 to 800 micrograms (mcg) of folic acid every day if you become pregnant. Ask for birth control (contraception) if you want to prevent pregnancy. Osteoporosis and menopause Osteoporosis is a disease in which the bones lose minerals and strength with aging. This can result in bone fractures. If you are 65 years old or older, or if you are at risk for osteoporosis and fractures, ask your health care provider if you should: Be screened for bone loss. Take a calcium or vitamin D supplement to lower your risk of fractures. Be given hormone replacement therapy (HRT) to treat symptoms of menopause. Follow these instructions at home: Lifestyle Do not use any products that contain nicotine or tobacco, such as cigarettes, e-cigarettes, and chewing tobacco. If you need help quitting, ask your health care provider. Do not use street drugs. Do not share needles. Ask your health care provider for help if you need support or information about quitting drugs. Alcohol use Do not drink alcohol if: Your health care provider tells you not to drink. You are pregnant, may be pregnant, or are planning to become pregnant. If you drink alcohol: Limit how much you use to 0-1 drink a day. Limit intake if you are breastfeeding. Be aware of how much alcohol is in your drink. In the [place] one drink equals one 12 oz bottle of beer (355 mL), one 5 oz glass of wine (148 mL), or one 1 oz glass of hard liquor (44 mL). General instructions Schedule regular health, dental, and eye exams. Stay current with your vaccines. Tell your health care provider if: You often feel depressed. You have ever been abused or do not feel safe at home. Summary Adopting a healthy lifestyle and getting preventive care are important in promoting health and wellness. Follow your health care provider's instructions about healthy diet, exercising, and getting tested or screened for diseases. Follow your health care provider's  instructions on monitoring your cholesterol and blood pressure. This information is not intended to replace advice given to you by your health care provider. Make sure you discuss any questions you have with your health care provider. Document Revised: 10/21/2020 Document Reviewed: 08/06/2018 Elsevier Patient Education  2022 Elsevier Inc.  

## 2021-05-30 LAB — HEPATITIS C ANTIBODY: Hep C Virus Ab: <0.1 s/co ratio (ref 0.0–0.9)

## 2021-05-30 LAB — RPR: RPR Ser Ql: NONREACTIVE

## 2021-05-30 LAB — HIV ANTIBODY (ROUTINE TESTING W REFLEX): HIV Screen 4th Generation wRfx: NONREACTIVE

## 2021-05-30 LAB — HEPATITIS B SURFACE ANTIGEN: Hepatitis B Surface Ag: NEGATIVE

## 2021-05-31 LAB — CERVICOVAGINAL ANCILLARY ONLY
Bacterial Vaginitis (gardnerella): POSITIVE — AB
Candida Glabrata: NEGATIVE
Candida Vaginitis: NEGATIVE
Chlamydia: NEGATIVE
Comment: NEGATIVE
Comment: NEGATIVE
Comment: NEGATIVE
Comment: NEGATIVE
Comment: NEGATIVE
Comment: NORMAL
Neisseria Gonorrhea: NEGATIVE
Trichomonas: POSITIVE — AB

## 2021-05-31 LAB — CYTOLOGY - PAP
Comment: NEGATIVE
Diagnosis: NEGATIVE
High risk HPV: NEGATIVE

## 2021-06-01 ENCOUNTER — Telehealth: Payer: Self-pay

## 2021-06-01 MED ORDER — METRONIDAZOLE 500 MG PO TABS: 500.0000 mg | ORAL_TABLET | Freq: Two times a day (BID) | ORAL | 0 refills | Status: DC

## 2021-06-01 NOTE — Telephone Encounter (Signed)
Rx sent per Dr Alysia Penna. Pt sent Mychart message for follow up appt.  Judeth Cornfield, RN

## 2021-06-01 NOTE — Telephone Encounter (Signed)
-----   Message from Hermina Staggers, MD sent at 06/01/2021  8:41 AM EDT ----- Please send in Flagyl 500 mg po bid x 7 days for Trich and BV. And a TOC nurse appt in 3-4 weeks. Pt is aware  Thanks Casimiro Needle

## 2021-07-04 ENCOUNTER — Telehealth: Payer: Self-pay

## 2021-07-04 NOTE — Telephone Encounter (Signed)
Chart reviewed during specimen log audit. Pt has not viewed MyChart messages or recent results. Last MyChart user log in 12/17/19. Called pt; VM left stating I am calling with results. Requested pt log into MyChart or return phone call. Letter mailed.

## 2021-08-14 ENCOUNTER — Ambulatory Visit (INDEPENDENT_AMBULATORY_CARE_PROVIDER_SITE_OTHER): Payer: Medicaid Other | Admitting: *Deleted

## 2021-08-14 ENCOUNTER — Other Ambulatory Visit: Payer: Self-pay

## 2021-08-14 VITALS — BP 126/70 | HR 90 | Ht 68.0 in | Wt 173.0 lb

## 2021-08-14 DIAGNOSIS — Z3042 Encounter for surveillance of injectable contraceptive: Secondary | ICD-10-CM | POA: Diagnosis not present

## 2021-08-14 MED ORDER — MEDROXYPROGESTERONE ACETATE 150 MG/ML IM SUSP
150.0000 mg | Freq: Once | INTRAMUSCULAR | Status: AC
  Administered 2021-08-14: 09:00:00 150 mg via INTRAMUSCULAR

## 2021-08-14 NOTE — Progress Notes (Signed)
Misty Hall here for Depo-Provera  Injection.  Injection administered without complication. Patient will return in 10/30/21-11/13/21 for next injection. Last pap , last depo-provera and last annuual 05/29/21.   Trinidad Petron,RN 08/14/2021  9:19 AM

## 2021-08-14 NOTE — Progress Notes (Signed)
Attestation of Attending Supervision of clinical support staff: I agree with the care provided to this patient and was available for any consultation.  I have reviewed the RN's note and chart. I was available for consult and to see the patient if needed.   Liborio Saccente MD MPH Attending Physician Faculty Practice- [facility]  

## 2021-10-30 ENCOUNTER — Other Ambulatory Visit: Payer: Self-pay

## 2021-10-30 ENCOUNTER — Ambulatory Visit (INDEPENDENT_AMBULATORY_CARE_PROVIDER_SITE_OTHER): Payer: Medicaid Other

## 2021-10-30 VITALS — BP 123/89 | HR 83 | Wt 173.1 lb

## 2021-10-30 DIAGNOSIS — Z3042 Encounter for surveillance of injectable contraceptive: Secondary | ICD-10-CM

## 2021-10-30 MED ORDER — MEDROXYPROGESTERONE ACETATE 150 MG/ML IM SUSP
150.0000 mg | Freq: Once | INTRAMUSCULAR | Status: AC
  Administered 2021-10-30: 150 mg via INTRAMUSCULAR

## 2021-10-30 NOTE — Progress Notes (Signed)
Misty Hall here for Depo-Provera Injection. Injection administered without complication. Patient will return in 3 months for next injection between May 22 and June 5. Next annual visit due October 2023.  ? ?Cline Crock, RN ?10/30/2021   ?

## 2021-12-21 ENCOUNTER — Emergency Department (HOSPITAL_COMMUNITY)
Admission: EM | Admit: 2021-12-21 | Discharge: 2021-12-21 | Disposition: A | Discharge status: Home / Self Care | Payer: Medicaid Other | Attending: Emergency Medicine | Admitting: Emergency Medicine

## 2021-12-21 ENCOUNTER — Other Ambulatory Visit: Payer: Self-pay

## 2021-12-21 DIAGNOSIS — N390 Urinary tract infection, site not specified: Secondary | ICD-10-CM | POA: Diagnosis not present

## 2021-12-21 DIAGNOSIS — A599 Trichomoniasis, unspecified: Secondary | ICD-10-CM | POA: Insufficient documentation

## 2021-12-21 DIAGNOSIS — R300 Dysuria: Secondary | ICD-10-CM | POA: Diagnosis present

## 2021-12-21 LAB — URINALYSIS, ROUTINE W REFLEX MICROSCOPIC
Bilirubin Urine: NEGATIVE
Glucose, UA: NEGATIVE mg/dL
Hgb urine dipstick: NEGATIVE
Ketones, ur: NEGATIVE mg/dL
Nitrite: NEGATIVE
Protein, ur: NEGATIVE mg/dL
Specific Gravity, Urine: 1.015 (ref 1.005–1.030)
WBC, UA: >50 WBC/hpf — ABNORMAL HIGH (ref 0–5)
pH: 5 (ref 5.0–8.0)

## 2021-12-21 MED ORDER — NITROFURANTOIN MONOHYD MACRO 100 MG PO CAPS: 100.0000 mg | ORAL_CAPSULE | Freq: Two times a day (BID) | ORAL | 0 refills | Status: DC

## 2021-12-21 MED ORDER — METRONIDAZOLE 500 MG PO TABS: 500.0000 mg | ORAL_TABLET | Freq: Two times a day (BID) | ORAL | 0 refills | Status: DC

## 2021-12-21 NOTE — Discharge Instructions (Signed)
Please take antibiotics as prescribed.  Please do not mix with alcohol.  You do have evidence of a UTI in addition to a trichomonas infection.  He is antibiotics will take care of this.  Would like for you to return to the emergency department for any worsening symptoms you might have. ?

## 2021-12-21 NOTE — ED Triage Notes (Signed)
Pt reports increase bladder pressure since yesterday. Pt states that they are unable to completely empty while urinating. ? ?

## 2021-12-21 NOTE — ED Notes (Signed)
Pt verbalizes understanding of discharge instructions. Opportunity for questions and answers were provided. Pt discharged from the ED.   ?

## 2021-12-21 NOTE — ED Provider Notes (Signed)
?[facility] EMERGENCY DEPARTMENT ?Provider Note ? ? ?CSN: [id number] ?Arrival date & time: 12/21/21  0825 ? ?  ? ?History ?Chief Complaint  ?Patient presents with  ? Dysuria  ? ? ?Misty Hall is a 36 y.o. female who presents to the emergency department today with a 1 day history of urinary urgency, frequency, and suprapubic pressure.  She denies fever, chills, nausea, vomiting, diarrhea.  Patient also having some left-sided flank pain.  This is constant.  Patient does not believe she is pregnant as she is taking birth control and has not missed any doses. She is currently taking injections for birth control.  ? ? ?Dysuria ? ?  ? ?Home Medications ?Prior to Admission medications   ?Medication Sig Start Date End Date Taking? Authorizing Provider  ?metroNIDAZOLE (FLAGYL) 500 MG tablet Take 1 tablet (500 mg total) by mouth 2 (two) times daily. 12/21/21  Yes Meredeth Ide, Shahidah Nesbitt M, PA-C  ?nitrofurantoin, macrocrystal-monohydrate, (MACROBID) 100 MG capsule Take 1 capsule (100 mg total) by mouth 2 (two) times daily. 12/21/21  Yes Teressa Lower, PA-C  ?   ? ?Allergies    ?Aleve [naproxen sodium]   ? ?Review of Systems   ?Review of Systems  ?Genitourinary:  Positive for dysuria.  ?All other systems reviewed and are negative. ? ?Physical Exam ?Updated Vital Signs ?BP 137/87 (BP Location: Right Arm)   Pulse 91   Temp 98.6 ?F (37 ?C) (Oral)   Resp 14   SpO2 100%  ?Physical Exam ?Vitals and nursing note reviewed.  ?Constitutional:   ?   General: She is not in acute distress. ?   Appearance: Normal appearance.  ?HENT:  ?   Head: Normocephalic and atraumatic.  ?Eyes:  ?   General:     ?   Right eye: No discharge.     ?   Left eye: No discharge.  ?Cardiovascular:  ?   Comments: Regular rate and rhythm.  S1/S2 are distinct without any evidence of murmur, rubs, or gallops.  Radial pulses are 2+ bilaterally.  Dorsalis pedis pulses are 2+ bilaterally.  No evidence of pedal edema. ?Pulmonary:  ?   Comments: Clear  to auscultation bilaterally.  Normal effort.  No respiratory distress.  No evidence of wheezes, rales, or rhonchi heard throughout. ?Abdominal:  ?   General: Abdomen is flat. Bowel sounds are normal. There is no distension.  ?   Tenderness: There is no abdominal tenderness. There is no right CVA tenderness, left CVA tenderness, guarding or rebound.  ?Musculoskeletal:     ?   General: Normal range of motion.  ?   Cervical back: Neck supple.  ?Skin: ?   General: Skin is warm and dry.  ?   Findings: No rash.  ?Neurological:  ?   General: No focal deficit present.  ?   Mental Status: She is alert.  ?Psychiatric:     ?   Mood and Affect: Mood normal.     ?   Behavior: Behavior normal.  ? ? ?ED Results / Procedures / Treatments   ?Labs ?(all labs ordered are listed, but only abnormal results are displayed) ?Labs Reviewed  ?URINALYSIS, ROUTINE W REFLEX MICROSCOPIC - Abnormal; Notable for the following components:  ?    Result Value  ? APPearance CLOUDY (*)   ? Leukocytes,Ua LARGE (*)   ? WBC, UA >50 (*)   ? Bacteria, UA RARE (*)   ? Trichomonas, UA PRESENT (*)   ? All other components within normal  limits  ? ? ?EKG ?None ? ?Radiology ?No results found. ? ?Procedures ?Procedures  ? ? ?Medications Ordered in ED ?Medications - No data to display ? ?ED Course/ Medical Decision Making/ A&P ?  ?                        ?Medical Decision Making ?Amount and/or Complexity of Data Reviewed ?Labs: ordered. ? ? ?This patient presents to the ED for concern of suprapubic pressure, urinary urgency, and frequency, this involves an extensive number of treatment options, and is a complaint that carries with it a high risk of complications and morbidity.  The differential diagnosis includes urinary tract infection.  I have a low suspicion for pyelonephritis, sepsis, pregnancy at this time. ? ? ?Co morbidities that complicate the patient evaluation ? ?Past Medical History:  ?Diagnosis Date  ? Hemoglobin A-S genotype (HCC)   ? Medical history  non-contributory   ? ? ?Additional history obtained: ? ?Additional history obtained from nursing note ? ? ?Lab Tests: ? ?I Ordered, and personally interpreted labs.  The pertinent results include: Urinalysis which shows evidence of trichomonas in addition to a UTI. ? ? ?Imaging Studies ordered: ? ?None ? ? ?Cardiac Monitoring: ? ?The patient was maintained on a cardiac monitor.  I personally viewed and interpreted the cardiac monitored which showed an underlying rhythm of: Normal sinus rhythm ? ? ?Medicines ordered and prescription drug management: ? ?None ? ?Test Considered: ? ?N/A ? ? ?Critical Interventions: ? ?N/A ? ? ?Problem List / ED Course: ? ?Patient presents to the emergency department with UTI symptoms.  Urine does appear to be infected with a large amount of pyuria.  Trichomonas is also present.  I will plan to treat her with nitrofurantoin in addition to metronidazole.  I will encourage the patient not to drink any alcohol while taking these medications.  Strict turn precautions were discussed with the patient.  She is safe for discharge. ? ? ?Reevaluation: ? ?After the interventions noted above, I reevaluated the patient and found that they have :stayed the same ? ? ?Social Determinants of Health: ? ?Social Determinants of Health with Concerns  ? ?Financial Resource Strain: Not on file  ?Food Insecurity: Not on file  ?Transportation Needs: Not on file  ?Physical Activity: Not on file  ?Stress: Not on file  ?Social Connections: Not on file  ?Intimate Partner Violence: Not on file  ?Depression (PHQ2-9): Not on file  ?Alcohol Screen: Not on file  ?Housing: Not on file  ? ? ?Disposition: ? ?After consideration of the diagnostic results and the patients response to treatment, I feel that the patient would benefit from outpatient follow-up. ? ?Final Clinical Impression(s) / ED Diagnoses ?Final diagnoses:  ?Lower urinary tract infectious disease  ?Trichomonas infection  ? ? ?Rx / DC Orders ?ED Discharge  Orders   ? ?      Ordered  ?  nitrofurantoin, macrocrystal-monohydrate, (MACROBID) 100 MG capsule  2 times daily       ? 12/21/21 0930  ?  metroNIDAZOLE (FLAGYL) 500 MG tablet  2 times daily       ? 12/21/21 0930  ? ?  ?  ? ?  ? ? ?  ?Honor Loh Herrings, PA-C ?12/21/21 0932 ? ?  ?Terrilee Files, MD ?12/21/21 1829 ? ?

## 2022-01-15 ENCOUNTER — Ambulatory Visit (INDEPENDENT_AMBULATORY_CARE_PROVIDER_SITE_OTHER): Payer: Medicaid Other | Admitting: General Practice

## 2022-01-15 VITALS — BP 130/86 | HR 80 | Ht 68.0 in | Wt 173.0 lb

## 2022-01-15 DIAGNOSIS — Z3042 Encounter for surveillance of injectable contraceptive: Secondary | ICD-10-CM

## 2022-01-15 MED ORDER — MEDROXYPROGESTERONE ACETATE 150 MG/ML IM SUSP
150.0000 mg | Freq: Once | INTRAMUSCULAR | Status: AC
  Administered 2022-01-15: 150 mg via INTRAMUSCULAR

## 2022-01-15 NOTE — Progress Notes (Signed)
Misty Hall here for Depo-Provera Injection. Injection administered without complication. Patient will return in 3 months for next injection between Aug 7 and Aug 21. Next annual visit due October 2023.   Marylynn Pearson, RN 01/15/2022  9:28 AM

## 2022-04-02 ENCOUNTER — Ambulatory Visit (INDEPENDENT_AMBULATORY_CARE_PROVIDER_SITE_OTHER): Payer: Medicaid Other | Admitting: General Practice

## 2022-04-02 VITALS — BP 126/71 | HR 103 | Ht 68.0 in | Wt 163.0 lb

## 2022-04-02 DIAGNOSIS — Z3042 Encounter for surveillance of injectable contraceptive: Secondary | ICD-10-CM

## 2022-04-02 MED ORDER — MEDROXYPROGESTERONE ACETATE 150 MG/ML IM SUSP
150.0000 mg | Freq: Once | INTRAMUSCULAR | Status: AC
  Administered 2022-04-02: 150 mg via INTRAMUSCULAR

## 2022-04-02 NOTE — Progress Notes (Signed)
Misty Hall here for Depo-Provera Injection. Injection administered without complication. Patient will return in 3 months for next injection between Oct 23 and Nov 6. Next annual visit due October 2023.   Marylynn Pearson, RN 04/02/2022  11:29 AM

## 2022-04-20 ENCOUNTER — Other Ambulatory Visit: Payer: Self-pay | Admitting: Family Medicine

## 2022-04-20 DIAGNOSIS — N6320 Unspecified lump in the left breast, unspecified quadrant: Secondary | ICD-10-CM

## 2022-06-06 DIAGNOSIS — N6042 Mammary duct ectasia of left breast: Secondary | ICD-10-CM | POA: Insufficient documentation

## 2022-06-06 DIAGNOSIS — N611 Abscess of the breast and nipple: Secondary | ICD-10-CM | POA: Insufficient documentation

## 2022-06-06 HISTORY — DX: Mammary duct ectasia of left breast: N60.42

## 2022-06-06 HISTORY — DX: Abscess of the breast and nipple: N61.1

## 2022-06-20 ENCOUNTER — Ambulatory Visit: Payer: Medicaid Other | Admitting: Obstetrics and Gynecology

## 2022-07-09 LAB — CYTOLOGY - PAP: Pap: NEGATIVE

## 2022-11-13 ENCOUNTER — Ambulatory Visit (INDEPENDENT_AMBULATORY_CARE_PROVIDER_SITE_OTHER): Payer: Medicaid Other

## 2022-11-13 ENCOUNTER — Other Ambulatory Visit: Payer: Self-pay

## 2022-11-13 DIAGNOSIS — Z3042 Encounter for surveillance of injectable contraceptive: Secondary | ICD-10-CM

## 2022-11-13 NOTE — Progress Notes (Signed)
Here today to restart Depo Provera. Pt incorrectly scheduled for appt. Needs provider visit for initial prescription of Depo-- last visit Oct 2022. Pt will return tomorrow, 11/14/22 at 8:15 AM.   Apolonio Schneiders RN

## 2022-11-14 ENCOUNTER — Ambulatory Visit (INDEPENDENT_AMBULATORY_CARE_PROVIDER_SITE_OTHER): Payer: Medicaid Other | Admitting: Obstetrics & Gynecology

## 2022-11-14 ENCOUNTER — Encounter: Payer: Self-pay | Admitting: Obstetrics & Gynecology

## 2022-11-14 VITALS — BP 121/73 | HR 88 | Ht 68.0 in | Wt 152.0 lb

## 2022-11-14 DIAGNOSIS — Z3042 Encounter for surveillance of injectable contraceptive: Secondary | ICD-10-CM | POA: Diagnosis not present

## 2022-11-14 LAB — POCT PREGNANCY, URINE: Preg Test, Ur: NEGATIVE

## 2022-11-14 MED ORDER — MEDROXYPROGESTERONE ACETATE 150 MG/ML IM SUSY
150.0000 mg | PREFILLED_SYRINGE | Freq: Once | INTRAMUSCULAR | Status: AC
  Administered 2022-11-14: 150 mg via INTRAMUSCULAR

## 2022-11-14 NOTE — Progress Notes (Signed)
Patient is 16 weeks since her last DMPA injection and requests to continue this method. Currently on period and negative UPT. She may continue Depo Provera and return in 3 months.  Woodroe Mode, MD

## 2022-11-21 ENCOUNTER — Encounter: Payer: Self-pay | Admitting: General Practice

## 2023-01-30 ENCOUNTER — Ambulatory Visit (INDEPENDENT_AMBULATORY_CARE_PROVIDER_SITE_OTHER): Payer: Medicaid Other | Admitting: *Deleted

## 2023-01-30 ENCOUNTER — Other Ambulatory Visit: Payer: Self-pay

## 2023-01-30 VITALS — BP 120/81 | HR 81 | Ht 68.0 in | Wt 153.5 lb

## 2023-01-30 DIAGNOSIS — Z3042 Encounter for surveillance of injectable contraceptive: Secondary | ICD-10-CM

## 2023-01-30 MED ORDER — MEDROXYPROGESTERONE ACETATE 150 MG/ML IM SUSP
150.0000 mg | Freq: Once | INTRAMUSCULAR | Status: AC
  Administered 2023-01-30: 150 mg via INTRAMUSCULAR

## 2023-01-30 NOTE — Progress Notes (Signed)
Depo provera 150 mg IM administered as scheduled. Pt tolerated well. Next dose due 8/21-9/4. Last Pap done 07/09/22 - negative.

## 2023-04-22 ENCOUNTER — Ambulatory Visit (INDEPENDENT_AMBULATORY_CARE_PROVIDER_SITE_OTHER): Payer: Medicaid Other | Admitting: *Deleted

## 2023-04-22 ENCOUNTER — Other Ambulatory Visit: Payer: Self-pay

## 2023-04-22 VITALS — BP 135/80 | HR 84 | Ht 69.0 in | Wt 154.7 lb

## 2023-04-22 DIAGNOSIS — Z3042 Encounter for surveillance of injectable contraceptive: Secondary | ICD-10-CM | POA: Diagnosis not present

## 2023-04-22 MED ORDER — MEDROXYPROGESTERONE ACETATE 150 MG/ML IM SUSY
150.0000 mg | PREFILLED_SYRINGE | Freq: Once | INTRAMUSCULAR | Status: AC
  Administered 2023-04-22: 150 mg via INTRAMUSCULAR

## 2023-04-22 NOTE — Progress Notes (Signed)
Here for depo-provera . Last injection given 01/30/23. Last exam and pap 07/09/22.  Injection given without complaints. Sent to desk to schedule next injection and annual exam. Nancy Fetter

## 2023-07-12 ENCOUNTER — Other Ambulatory Visit: Payer: Self-pay

## 2023-07-12 ENCOUNTER — Ambulatory Visit (INDEPENDENT_AMBULATORY_CARE_PROVIDER_SITE_OTHER): Payer: Medicaid Other | Admitting: Family Medicine

## 2023-07-12 ENCOUNTER — Encounter: Payer: Self-pay | Admitting: Family Medicine

## 2023-07-12 VITALS — BP 118/84 | HR 89 | Wt 159.7 lb

## 2023-07-12 DIAGNOSIS — Z0000 Encounter for general adult medical examination without abnormal findings: Secondary | ICD-10-CM

## 2023-07-12 DIAGNOSIS — Z1330 Encounter for screening examination for mental health and behavioral disorders, unspecified: Secondary | ICD-10-CM

## 2023-07-12 DIAGNOSIS — Z3042 Encounter for surveillance of injectable contraceptive: Secondary | ICD-10-CM

## 2023-07-12 MED ORDER — MEDROXYPROGESTERONE ACETATE 150 MG/ML IM SUSP
150.0000 mg | Freq: Once | INTRAMUSCULAR | Status: AC
  Administered 2023-07-12: 150 mg via INTRAMUSCULAR

## 2023-07-12 NOTE — Progress Notes (Signed)
ANNUAL EXAM Patient name: Misty Hall MRN [id number]  Date of birth: [birth year] Chief Complaint:   Gynecologic Exam  History of Present Illness:   Misty Hall is a 37 y.o. [phone] African-American female being seen today for a routine annual exam.  Current complaints: None  No LMP recorded. Patient has had an injection.   The pregnancy intention screening data noted above was reviewed. Potential methods of contraception were discussed. The patient elected to proceed with No data recorded.   Last pap 07/09/2022. Results were: NILM w/ HRHPV negative. H/O abnormal pap: no     11/14/2022    8:14 AM 05/30/2016    1:43 PM 03/26/2016    2:32 PM 03/22/2016    4:35 PM  Depression screen PHQ 2/9  Decreased Interest 0 0 1 0  Down, Depressed, Hopeless 0 0 0 3  PHQ - 2 Score 0 0 1 3  Altered sleeping 0 0 0 3  Tired, decreased energy 0 0 0 0  Change in appetite 0 0 0 3  Feeling bad or failure about yourself  0 0 0 0  Trouble concentrating 0 0 0 0  Moving slowly or fidgety/restless 0 0 0 0  Suicidal thoughts 0 0 0 0  PHQ-9 Score 0 0 1 9        11/14/2022    8:14 AM 05/30/2016    1:44 PM 03/26/2016    2:31 PM 03/22/2016    4:35 PM  GAD 7 : Generalized Anxiety Score  Nervous, Anxious, on Edge 1 0 1 2  Control/stop worrying 1 0 1 2  Worry too much - different things 1 1 1 2   Trouble relaxing 0 1 1 2   Restless 0 0 2 1  Easily annoyed or irritable 1 1 2 1   Afraid - awful might happen 1 1 1 1   Total GAD 7 Score 5 4 9 11      Review of Systems:   Pertinent items are noted in HPI Denies any headaches, blurred vision, fatigue, shortness of breath, chest pain, abdominal pain, abnormal vaginal discharge/itching/odor/irritation, problems with periods, bowel movements, urination, or intercourse unless otherwise stated above. Pertinent History Reviewed:  Reviewed past medical,surgical, social and family history.  Reviewed problem list, medications and allergies. Physical Assessment:    Vitals:   07/12/23 1052  BP: 118/84  Pulse: 89  Weight: 159 lb 11.2 oz (72.4 kg)  Body mass index is 23.58 kg/m.        Physical Examination:   General appearance - well appearing, and in no distress  Mental status - alert, oriented to person, place, and time  Psych:  She has a normal mood and affect  Skin - warm and dry, normal color, no suspicious lesions noted  Chest - effort normal, all lung fields clear to auscultation bilaterally  Heart - normal rate and regular rhythm  Neck:  midline trachea, no thyromegaly or nodule  Abdomen - soft, nontender, nondistended, no masses or organomegaly  Extremities:  No swelling or varicosities noted  Chaperone present for exam  No results found for this or any previous visit (from the past 24 hour(s)).  Assessment & Plan:  1) Well-Woman Exam  2) contraceptive management Patient has been on Depo for approximately 2 years.  Reports she is extremely happy with this medication.  Discussed switching to long term birth control like Nexplanon or IUD but patient is not interested at this time.  She is occasionally sexually active and has not missed a dose  of Depo.  Would like another dose of Depo but will consider other options.  Labs/procedures today: None  No orders of the defined types were placed in this encounter.   Meds:  Meds ordered this encounter  Medications   medroxyPROGESTERone (DEPO-PROVERA) injection 150 mg    Follow-up: No follow-ups on file.  Celedonio Savage, MD 07/12/2023 11:21 AM

## 2023-09-27 ENCOUNTER — Ambulatory Visit (INDEPENDENT_AMBULATORY_CARE_PROVIDER_SITE_OTHER): Payer: Medicaid Other

## 2023-09-27 ENCOUNTER — Other Ambulatory Visit: Payer: Self-pay

## 2023-09-27 VITALS — BP 127/83 | HR 83 | Wt 159.9 lb

## 2023-09-27 DIAGNOSIS — Z3042 Encounter for surveillance of injectable contraceptive: Secondary | ICD-10-CM

## 2023-09-27 MED ORDER — MEDROXYPROGESTERONE ACETATE 150 MG/ML IM SUSY
150.0000 mg | PREFILLED_SYRINGE | Freq: Once | INTRAMUSCULAR | Status: AC
  Administered 2023-09-27: 150 mg via INTRAMUSCULAR

## 2023-09-27 NOTE — Progress Notes (Signed)
Misty Hall here for Depo-Provera Injection. Injection administered without complication. Patient is considering changing method to more long term option, specifically IUD. Patient will return in 3 months for next injection between 12/13/23 and 12/27/23 or for insertion of IUD prior to that time if desired. Next annual visit due 07/11/24.   Marjo Bicker, RN 09/27/2023  10:31 AM

## 2023-12-16 ENCOUNTER — Ambulatory Visit: Payer: Medicaid Other | Admitting: *Deleted

## 2023-12-16 ENCOUNTER — Other Ambulatory Visit: Payer: Self-pay

## 2023-12-16 VITALS — BP 127/83 | HR 87

## 2023-12-16 DIAGNOSIS — Z3042 Encounter for surveillance of injectable contraceptive: Secondary | ICD-10-CM | POA: Diagnosis not present

## 2023-12-16 MED ORDER — MEDROXYPROGESTERONE ACETATE 150 MG/ML IM SUSY
150.0000 mg | PREFILLED_SYRINGE | Freq: Once | INTRAMUSCULAR | Status: AC
  Administered 2023-12-16: 150 mg via INTRAMUSCULAR

## 2023-12-16 NOTE — Progress Notes (Signed)
 Misty Hall here for Depo-Provera   Injection.  Last injection 09/27/23. Last annual 07/12/23. Last pap 07/09/22. Injection administered without complication. Patient will return in 3 months for next injection. She is considering switching birth control but unsure to what method. Will make appointment with provider at checkout. Alejandra Hurst 12/16/2023  2:12 PM

## 2024-03-02 ENCOUNTER — Other Ambulatory Visit: Payer: Self-pay

## 2024-03-02 ENCOUNTER — Ambulatory Visit (INDEPENDENT_AMBULATORY_CARE_PROVIDER_SITE_OTHER): Admitting: *Deleted

## 2024-03-02 VITALS — BP 110/70 | HR 91 | Wt 161.1 lb

## 2024-03-02 DIAGNOSIS — Z30013 Encounter for initial prescription of injectable contraceptive: Secondary | ICD-10-CM

## 2024-03-02 DIAGNOSIS — Z3042 Encounter for surveillance of injectable contraceptive: Secondary | ICD-10-CM

## 2024-03-02 MED ORDER — MEDROXYPROGESTERONE ACETATE 150 MG/ML IM SUSP
150.0000 mg | Freq: Once | INTRAMUSCULAR | Status: AC
  Administered 2024-03-02: 150 mg via INTRAMUSCULAR

## 2024-03-02 NOTE — Progress Notes (Signed)
 Misty Hall here for Depo-Provera  Injection. Injection administered without complication. Patient will return in 3 months for next injection between 0922 and 06/01/2024. Next annual visit due 07/12/24.  She is considering changing to another birth control and will make appointment at checkout today.  Birth Control information given.  Elaine Rosina Garre, RN 03/02/2024  3:53 PM

## 2024-05-19 ENCOUNTER — Other Ambulatory Visit: Payer: Self-pay

## 2024-05-19 ENCOUNTER — Ambulatory Visit: Admitting: *Deleted

## 2024-05-19 VITALS — BP 129/78 | HR 90 | Wt 160.0 lb

## 2024-05-19 DIAGNOSIS — Z3042 Encounter for surveillance of injectable contraceptive: Secondary | ICD-10-CM

## 2024-05-19 MED ORDER — MEDROXYPROGESTERONE ACETATE 150 MG/ML IM SUSP
150.0000 mg | Freq: Once | INTRAMUSCULAR | Status: AC
  Administered 2024-05-19: 150 mg via INTRAMUSCULAR

## 2024-05-19 NOTE — Progress Notes (Addendum)
 Misty Hall here for Depo-Provera  Injection. Injection administered without complication. Patient will return in 3 months for next injection between 08/04/24 and 08/18/2024. Next annual visit due 07/11/2024.   Rosina, RN

## 2024-07-15 ENCOUNTER — Other Ambulatory Visit (HOSPITAL_COMMUNITY)
Admission: RE | Admit: 2024-07-15 | Discharge: 2024-07-15 | Disposition: A | Discharge status: Home / Self Care | Source: Ambulatory Visit | Attending: Obstetrics and Gynecology | Admitting: Obstetrics and Gynecology

## 2024-07-15 ENCOUNTER — Other Ambulatory Visit: Payer: Self-pay

## 2024-07-15 ENCOUNTER — Ambulatory Visit (INDEPENDENT_AMBULATORY_CARE_PROVIDER_SITE_OTHER): Admitting: Obstetrics and Gynecology

## 2024-07-15 VITALS — BP 133/89 | HR 98 | Wt 159.8 lb

## 2024-07-15 DIAGNOSIS — Z01419 Encounter for gynecological examination (general) (routine) without abnormal findings: Secondary | ICD-10-CM | POA: Diagnosis present

## 2024-07-15 DIAGNOSIS — Z1331 Encounter for screening for depression: Secondary | ICD-10-CM

## 2024-07-15 DIAGNOSIS — Z124 Encounter for screening for malignant neoplasm of cervix: Secondary | ICD-10-CM | POA: Diagnosis present

## 2024-07-15 DIAGNOSIS — Z3042 Encounter for surveillance of injectable contraceptive: Secondary | ICD-10-CM | POA: Diagnosis not present

## 2024-07-15 MED ORDER — MEDROXYPROGESTERONE ACETATE 150 MG/ML IM SUSY
150.0000 mg | PREFILLED_SYRINGE | Freq: Once | INTRAMUSCULAR | Status: AC
  Administered 2024-07-15: 150 mg via INTRAMUSCULAR

## 2024-07-15 NOTE — Progress Notes (Signed)
 ANNUAL EXAM Patient name: Misty Hall MRN [id number]  Date of birth: 29-Aug-1985 Chief Complaint:   Gynecologic Exam  History of Present Illness:   Misty Hall is a 38 y.o. [phone] being seen today for a routine annual exam.  Current complaints: annual, pap, depo  Menstrual concerns? Yes  spotting with depo  Breast or nipple changes? No had prior surgery from abscess Contraception use? Yes depo provera  Sexually active? Yes female partner  No LMP recorded. Patient has had an injection.   The pregnancy intention screening data noted above was reviewed. Potential methods of contraception were discussed. The patient elected to proceed with No data recorded.   Last pap     Component Value Date/Time   DIAGPAP  05/29/2021 0929    - Negative for intraepithelial lesion or malignancy (NILM)   DIAGPAP  03/18/2018 0000    NEGATIVE FOR INTRAEPITHELIAL LESIONS OR MALIGNANCY.   DIAGPAP  01/30/2017 0000    NEGATIVE FOR INTRAEPITHELIAL LESIONS OR MALIGNANCY. BENIGN REACTIVE/REPARATIVE CHANGES.   HPVHIGH Negative 05/29/2021 0929   ADEQPAP  05/29/2021 0929    Satisfactory for evaluation; transformation zone component PRESENT.   ADEQPAP  03/18/2018 0000    Satisfactory for evaluation  endocervical/transformation zone component PRESENT.   ADEQPAP  01/30/2017 0000    Satisfactory for evaluation  endocervical/transformation zone component PRESENT.   Abnormal pap years ago but normal since  Last mammogram: none.  Last colonoscopy: n/a.      07/12/2023   11:33 AM 11/14/2022    8:14 AM 05/30/2016    1:43 PM 03/26/2016    2:32 PM 03/22/2016    4:35 PM  Depression screen PHQ 2/9  Decreased Interest 1 0 0 1 0  Down, Depressed, Hopeless 0 0 0 0 3  PHQ - 2 Score 1 0 0 1 3  Altered sleeping 0 0 0 0 3  Tired, decreased energy 0 0 0 0 0  Change in appetite 0 0 0 0 3  Feeling bad or failure about yourself  0 0 0 0 0  Trouble concentrating 1 0 0 0 0  Moving slowly or fidgety/restless 0 0 0  0 0  Suicidal thoughts 0 0 0  0  0   PHQ-9 Score 2  0  0  1  9      Data saved with a previous flowsheet row definition        07/12/2023   11:34 AM 11/14/2022    8:14 AM 05/30/2016    1:44 PM 03/26/2016    2:31 PM  GAD 7 : Generalized Anxiety Score  Nervous, Anxious, on Edge 1 1 0 1  Control/stop worrying 1 1 0 1  Worry too much - different things 1 1 1 1   Trouble relaxing 1 0 1 1  Restless 0 0 0 2  Easily annoyed or irritable 1 1 1 2   Afraid - awful might happen 1 1 1 1   Total GAD 7 Score 6 5 4 9      Review of Systems:   Pertinent items are noted in HPI Denies any headaches, blurred vision, fatigue, shortness of breath, chest pain, abdominal pain, abnormal vaginal discharge/itching/odor/irritation, problems with periods, bowel movements, urination, or intercourse unless otherwise stated above. Pertinent History Reviewed:  Reviewed past medical,surgical, social and family history.  Reviewed problem list, medications and allergies. Physical Assessment:   Vitals:   07/15/24 1602  BP: 133/89  Pulse: 98  Weight: 159 lb 12.8 oz (72.5 kg)  Body mass index is  23.6 kg/m.        Physical Examination:   General appearance - well appearing, and in no distress  Mental status - alert, oriented to person, place, and time  Psych:  She has a normal mood and affect  Skin - warm and dry, normal color, no suspicious lesions noted  Chest - effort normal, all lung fields clear to auscultation bilaterally  Heart - normal rate and regular rhythm  Abdomen - soft, nontender, nondistended, no masses or organomegaly  Pelvic -  VULVA: normal appearing vulva with no masses, tenderness or lesions   VAGINA: normal appearing vagina with normal color and discharge, no lesions   CERVIX: incompletely visualized, no masses, no CMT  Thin prep pap is done with HR HPV cotesting  Extremities:  No swelling or varicosities noted  Chaperone present for exam  No results found for this or any previous  visit (from the past 24 hours).    Assessment & Plan:  1. Well woman exam with routine gynecological exam (Primary) - Cervical cancer screening: Discussed guidelines. Pap with HPV collected - STD Testing: accepts - Birth Control: Discussed options and their risks, benefits and common side effects; discussed VTE with estrogen containing options. Desires: Depo - Breast Health: Encouraged self breast awareness/SBE. Teaching provided. Discussed limits of clinical breast exam for detecting breast cancer. Rx given for MXR - F/U 12 months and prn  - Cytology - PAP - MM 3D SCREENING MAMMOGRAM BILATERAL BREAST; Future  2. Screening for cervical cancer Pap collected -  - Cytology - PAP  3. Encounter for surveillance of injectable contraceptive Depo given early today. Noted possible change in bleeding given early administration.  - medroxyPROGESTERone  Acetate SUSY 150 mg   No orders of the defined types were placed in this encounter.   Meds:  Meds ordered this encounter  Medications   medroxyPROGESTERone  Acetate SUSY 150 mg    Follow-up: No follow-ups on file.  Carter Quarry, MD 07/15/2024 4:14 PM

## 2024-07-20 ENCOUNTER — Ambulatory Visit: Payer: Self-pay | Admitting: Obstetrics and Gynecology

## 2024-07-20 LAB — CYTOLOGY - PAP
Adequacy: ABSENT
Chlamydia: NEGATIVE
Comment: NEGATIVE
Comment: NEGATIVE
Comment: NEGATIVE
Comment: NORMAL
Diagnosis: NEGATIVE
High risk HPV: NEGATIVE
Neisseria Gonorrhea: NEGATIVE
Trichomonas: NEGATIVE

## 2024-08-04 ENCOUNTER — Ambulatory Visit: Payer: Self-pay

## 2024-08-13 ENCOUNTER — Ambulatory Visit

## 2024-09-30 ENCOUNTER — Other Ambulatory Visit: Payer: Self-pay

## 2024-09-30 ENCOUNTER — Ambulatory Visit

## 2024-09-30 VITALS — BP 126/74 | HR 79 | Ht 68.0 in | Wt 169.0 lb

## 2024-09-30 DIAGNOSIS — Z3042 Encounter for surveillance of injectable contraceptive: Secondary | ICD-10-CM

## 2024-09-30 MED ORDER — MEDROXYPROGESTERONE ACETATE 150 MG/ML IM SUSY
150.0000 mg | PREFILLED_SYRINGE | Freq: Once | INTRAMUSCULAR | Status: AC
  Administered 2024-09-30: 150 mg via INTRAMUSCULAR

## 2024-09-30 NOTE — Progress Notes (Signed)
 Misty Hall here for Depo-Provera  Injection. Last given on 07/15/25. Injection administered without complication. Patient will return in 3 months for next injection between April 22nd and May 6th. Next annual visit due 06/2025.   Rosaline Pendleton, RN 09/30/2024

## 2024-12-16 ENCOUNTER — Ambulatory Visit: Payer: Self-pay
